# Patient Record
Sex: Male | Born: 1985 | Hispanic: No | Marital: Single | Smoking: Never smoker
Health system: Southern US, Community
[De-identification: ages and names within clinical notes are randomized; demographics above are authoritative.]

## PROBLEM LIST (undated history)

## (undated) DIAGNOSIS — R7303 Prediabetes: Secondary | ICD-10-CM

## (undated) DIAGNOSIS — B019 Varicella without complication: Secondary | ICD-10-CM

## (undated) DIAGNOSIS — E785 Hyperlipidemia, unspecified: Secondary | ICD-10-CM

## (undated) HISTORY — DX: Varicella without complication: B01.9

## (undated) HISTORY — DX: Prediabetes: R73.03

## (undated) HISTORY — DX: Hyperlipidemia, unspecified: E78.5

---

## 2006-07-13 HISTORY — PX: FEMUR FRACTURE SURGERY: SHX633

## 2012-06-17 ENCOUNTER — Emergency Department: Payer: Self-pay | Admitting: Emergency Medicine

## 2015-09-12 ENCOUNTER — Encounter: Payer: Self-pay | Admitting: Registered Nurse

## 2015-09-12 ENCOUNTER — Ambulatory Visit: Payer: Self-pay | Admitting: Registered Nurse

## 2015-09-12 VITALS — BP 110/80 | HR 80 | Temp 98.8°F

## 2015-09-12 DIAGNOSIS — R197 Diarrhea, unspecified: Secondary | ICD-10-CM

## 2015-09-12 MED ORDER — LORATADINE 10 MG PO TABS: 10.0000 mg | ORAL_TABLET | Freq: Every day | ORAL | Status: DC

## 2015-09-12 MED ORDER — FLUTICASONE PROPIONATE 50 MCG/ACT NA SUSP: 1.0000 | Freq: Two times a day (BID) | NASAL | Status: DC

## 2015-09-12 NOTE — Progress Notes (Signed)
Subjective:    Patient ID: Jimmy Clark, male    DOB: 1986-04-14, 30 y.o.   MRN: [id number]  HPI Comments: Single african [place] male here for evaluation of vomiting, cough, runny nose, diarrhea.  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  Vomiting started on his day off 28 Feb after going to movies he ate cereal went to bed and woke up vomiting every 30 to 45 minutes that evening x 5 episodes (contents of stomach), chills, nonproductive cough, post nasal drip.  Denied fever.  Headache this am tolerating clear liquids urine still dark yellow.  Took motrin and felt better 28 Feb.  Diarrhea watery brown multiple times per day denied hematochezia bright red or black.  Chest congestion  Typically has spring allergies uses whatever cheapest allegra, zyrtec, claritin all work for him to stop runny nose.  Last week runny nose, sinus congestion and fatigue was worse. Missed a couple days of work last week also. Fatigued today.  New patient.     Review of Systems  Constitutional: Positive for chills, diaphoresis, activity change and fatigue. Negative for fever, appetite change and unexpected weight change.  HENT: Positive for congestion, postnasal drip, rhinorrhea, sinus pressure and sneezing. Negative for dental problem, drooling, ear discharge, ear pain, facial swelling, hearing loss, mouth sores, nosebleeds, sore throat, tinnitus, trouble swallowing and voice change.   Eyes: Negative for photophobia, pain, discharge, redness, itching and visual disturbance.  Respiratory: Positive for cough. Negative for choking, chest tightness, shortness of breath, wheezing and stridor.   Cardiovascular: Negative for chest pain, palpitations and leg swelling.  Gastrointestinal: Positive for nausea, vomiting, abdominal pain and diarrhea. Negative for constipation, blood in stool and abdominal distention.  Endocrine: Negative for cold intolerance and heat  intolerance.  Genitourinary: Negative for dysuria.  Musculoskeletal: Negative for myalgias, back pain, joint swelling, arthralgias, gait problem, neck pain and neck stiffness.  Skin: Negative for color change, pallor, rash and wound.  Allergic/Immunologic: Positive for environmental allergies. Negative for food allergies and immunocompromised state.  Neurological: Positive for headaches. Negative for dizziness, tremors, seizures, syncope, facial asymmetry, speech difficulty, weakness, light-headedness and numbness.  Hematological: Negative for adenopathy. Does not bruise/bleed easily.  Psychiatric/Behavioral: Positive for sleep disturbance. Negative for behavioral problems, confusion and agitation.       Objective:   Physical Exam  Constitutional: He is oriented to person, place, and time. Vital signs are normal. He appears well-developed and well-nourished. He is active and cooperative.  Non-toxic appearance. He does not have a sickly appearance. He appears ill. No distress.  HENT:  Head: Normocephalic and atraumatic.  Right Ear: Hearing, external ear and ear canal normal. A middle ear effusion is present.  Left Ear: Hearing, external ear and ear canal normal. A middle ear effusion is present.  Nose: Mucosal edema and rhinorrhea present. No nose lacerations, sinus tenderness, nasal deformity, septal deviation or nasal septal hematoma. No epistaxis.  No foreign bodies. Right sinus exhibits no maxillary sinus tenderness and no frontal sinus tenderness. Left sinus exhibits no maxillary sinus tenderness and no frontal sinus tenderness.  Mouth/Throat: Uvula is midline and mucous membranes are normal. Mucous membranes are not pale, not dry and not cyanotic. He does not have dentures. No oral lesions. No trismus in the jaw. Normal dentition. No dental abscesses, uvula swelling, lacerations or dental caries. Posterior oropharyngeal edema and posterior oropharyngeal erythema present. No oropharyngeal  exudate or tonsillar abscesses.  Cobblestoning posterior pharynx; bilateral nasal turbinates with  edema/erythema/white discharge; bilateral TMs with air fluid level slight opacity; bilateral tonsils 1+ edema no exudate  Eyes: Conjunctivae, EOM and lids are normal. Pupils are equal, round, and reactive to light. Right eye exhibits no chemosis, no discharge, no exudate and no hordeolum. No foreign body present in the right eye. Left eye exhibits no chemosis, no discharge, no exudate and no hordeolum. No foreign body present in the left eye. Right conjunctiva is not injected. Right conjunctiva has no hemorrhage. Left conjunctiva is not injected. Left conjunctiva has no hemorrhage. No scleral icterus. Right eye exhibits normal extraocular motion and no nystagmus. Left eye exhibits normal extraocular motion and no nystagmus. Right pupil is round and reactive. Left pupil is round and reactive. Pupils are equal.  Neck: Trachea normal and normal range of motion. Neck supple. No tracheal tenderness, no spinous process tenderness and no muscular tenderness present. No rigidity. No tracheal deviation, no edema, no erythema and normal range of motion present. No thyroid mass and no thyromegaly present.  Cardiovascular: Normal rate, regular rhythm, S1 normal, S2 normal, normal heart sounds and intact distal pulses.  PMI is not displaced.  Exam reveals no gallop and no friction rub.   No murmur heard. Pulmonary/Chest: Effort normal and breath sounds normal. No stridor. No respiratory distress. He has no decreased breath sounds. He has no wheezes. He has no rhonchi. He has no rales.  Abdominal: Soft. He exhibits no shifting dullness, no distension, no pulsatile liver, no fluid wave, no abdominal bruit, no ascites, no pulsatile midline mass and no mass. Bowel sounds are decreased. There is no hepatosplenomegaly. There is tenderness in the epigastric area, left upper quadrant and left lower quadrant. There is no rigidity, no  rebound, no guarding, no CVA tenderness, no tenderness at McBurney's point and negative Murphy's sign. Hernia confirmed negative in the ventral area.  Dull to percussion x 4 quads; c/o pain with percussion RLQ; moves from sitting to standing to lying without difficulty  Musculoskeletal: Normal range of motion. He exhibits no edema or tenderness.  Lymphadenopathy:       Head (right side): No submental, no submandibular, no tonsillar, no preauricular, no posterior auricular and no occipital adenopathy present.       Head (left side): No submental, no submandibular, no tonsillar, no preauricular, no posterior auricular and no occipital adenopathy present.    He has no cervical adenopathy.       Right cervical: No superficial cervical, no deep cervical and no posterior cervical adenopathy present.      Left cervical: No superficial cervical, no deep cervical and no posterior cervical adenopathy present.  Neurological: He is alert and oriented to person, place, and time. He displays no atrophy and no tremor. No cranial nerve deficit or sensory deficit. He exhibits normal muscle tone. He displays no seizure activity. Coordination and gait normal. GCS eye subscore is 4. GCS verbal subscore is 5. GCS motor subscore is 6.  Skin: Skin is warm, dry and intact. No abrasion, no bruising, no burn, no ecchymosis, no laceration, no lesion, no petechiae and no rash noted. He is not diaphoretic. No cyanosis or erythema. No pallor. Nails show no clubbing.  Psychiatric: He has a normal mood and affect. His speech is normal and behavior is normal. Judgment and thought content normal. Cognition and memory are normal.  Nursing note and vitals reviewed.     1130 patient notified rapid flu test negative. Patient verbalized understanding and had no further questions at this time.  Assessment & Plan:  A-viral gastroenteritis, seasonal allergic rhinitis, otitis media with effusion bilaterally P-Work excuse x 48 hours.  I  have recommended clear fluids and bland diet.  Avoid dairy/spicy, fried and large portions of meat while having nausea.  If vomiting hold po intake x 1 hour.  Then sips clear fluids like broths, ginger ale, power ade, gatorade, pedialyte may advance to soft/bland if no vomiting x 24 hours and appetite returned otherwise hydration main focus.  Hydrate hydrate hydrate until urine pale yellow/clear.  Discussed headache probably from dehydration/viral illness combination.   Return to the clinic if symptoms persist or worsen; I have alerted the patient to call if high fever, dehydration, marked weakness, fainting, increased abdominal pain, blood in stool or vomit (red or black).    Patient verbalized agreement and understanding of treatment plan and had no further questions at this time.  Restart claritin 10mg  po daily and flonase 1 spray each nostril BID.  Patient may use normal saline nasal spray as needed. Avoid triggers if possible.  Shower prior to bedtime if exposed to triggers.  If allergic dust/dust mites recommend mattress/pillow covers/encasements; washing linens, vacuuming, sweeping, dusting weekly.  Call or return to clinic as needed if these symptoms worsen or fail to improve as anticipated. Patient verbalized understanding of instructions, agreed with plan of care and had no further questions at this time.  P2:  Avoidance and hand washing.  Supportive treatment.   No evidence of invasive bacterial infection, non toxic and well hydrated.  This is most likely self limiting viral infection.  I do not see where any further testing or imaging is necessary at this time.   I will suggest supportive care, rest, good hygiene and encourage the patient to take adequate fluids.  The patient is to return to clinic or EMERGENCY ROOM if symptoms worsen or change significantly e.g. ear pain, fever, purulent discharge from ears or bleeding.  Patient verbalized agreement and understanding of treatment plan.

## 2015-09-17 ENCOUNTER — Ambulatory Visit: Payer: Self-pay | Admitting: Physician Assistant

## 2015-09-24 DIAGNOSIS — H5213 Myopia, bilateral: Secondary | ICD-10-CM | POA: Diagnosis not present

## 2015-09-30 ENCOUNTER — Ambulatory Visit (INDEPENDENT_AMBULATORY_CARE_PROVIDER_SITE_OTHER): Payer: 59 | Admitting: Family Medicine

## 2015-09-30 ENCOUNTER — Encounter: Payer: Self-pay | Admitting: Family Medicine

## 2015-09-30 VITALS — BP 124/82 | HR 80 | Temp 98.5°F | Ht 77.0 in | Wt 297.5 lb

## 2015-09-30 DIAGNOSIS — Z130 Encounter for screening for diseases of the blood and blood-forming organs and certain disorders involving the immune mechanism: Secondary | ICD-10-CM | POA: Diagnosis not present

## 2015-09-30 DIAGNOSIS — Z0000 Encounter for general adult medical examination without abnormal findings: Secondary | ICD-10-CM

## 2015-09-30 DIAGNOSIS — E669 Obesity, unspecified: Secondary | ICD-10-CM | POA: Diagnosis not present

## 2015-09-30 LAB — COMPREHENSIVE METABOLIC PANEL
ALBUMIN: 4.1 g/dL (ref 3.5–5.2)
ALK PHOS: 63 U/L (ref 39–117)
ALT: 59 U/L — AB (ref 0–53)
AST: 26 U/L (ref 0–37)
BILIRUBIN TOTAL: 0.3 mg/dL (ref 0.2–1.2)
BUN: 14 mg/dL (ref 6–23)
CALCIUM: 9.5 mg/dL (ref 8.4–10.5)
CO2: 29 mEq/L (ref 19–32)
Chloride: 104 mEq/L (ref 96–112)
Creatinine, Ser: 1.03 mg/dL (ref 0.40–1.50)
GFR: 109.81 mL/min (ref >60.00)
GLUCOSE: 89 mg/dL (ref 70–99)
Potassium: 4.2 mEq/L (ref 3.5–5.1)
Sodium: 139 mEq/L (ref 135–145)
TOTAL PROTEIN: 6.7 g/dL (ref 6.0–8.3)

## 2015-09-30 LAB — CBC
HCT: 41.9 % (ref 39.0–52.0)
HEMOGLOBIN: 13.6 g/dL (ref 13.0–17.0)
MCHC: 32.6 g/dL (ref 30.0–36.0)
MCV: 80.6 fl (ref 78.0–100.0)
PLATELETS: 311 10*3/uL (ref 150.0–400.0)
RBC: 5.19 Mil/uL (ref 4.22–5.81)
RDW: 14.3 % (ref 11.5–15.5)
WBC: 6 10*3/uL (ref 4.0–10.5)

## 2015-09-30 LAB — LIPID PANEL
CHOL/HDL RATIO: 6
Cholesterol: 147 mg/dL (ref 0–200)
HDL: 23 mg/dL — ABNORMAL LOW (ref >39.00)
LDL Cholesterol: 91 mg/dL (ref 0–99)
NonHDL: 124.22
TRIGLYCERIDES: 168 mg/dL — AB (ref 0.0–149.0)
VLDL: 33.6 mg/dL (ref 0.0–40.0)

## 2015-09-30 LAB — HEMOGLOBIN A1C: Hgb A1c MFr Bld: 6.2 % (ref 4.6–6.5)

## 2015-09-30 NOTE — Progress Notes (Signed)
Subjective:  Patient ID: Jimmy Clark, male    DOB: 1986-05-25  Age: 30 y.o. MRN: [id number]  CC: Establish care  HPI Jimmy Clark is a 30 y.o. male presents to the clinic today to establish care. No concerns today.  Preventative Healthcare  Immunizations  Tetanus - Up to date.   Pneumococcal - Not indicated.  Flu - Up to date.  Zoster - Not indicated.   Labs: Screening labs today.   Exercise: Screening labs today.   Alcohol use: See below.  Smoking/tobacco use: Nonsmoker.  Wears seat belt: Yes.  PMH, Surgical Hx, Family Hx, Social History reviewed and updated as below.  Past Medical History  Diagnosis Date  . Chicken pox    Past Surgical History  Procedure Laterality Date  . Femur fracture surgery  2008   Family hx - patient denies any family history.  Social History  Substance Use Topics  . Smoking status: Never Smoker   . Smokeless tobacco: Never Used  . Alcohol Use: 0.0 - 3.0 oz/week    0-4 Standard drinks or equivalent, 0-1 Cans of beer per week   Review of Systems  Gastrointestinal:       Recent nausea/vomiting/diarrhea (now resolved).   Musculoskeletal:       Muscle cramps/aches.   Psychiatric/Behavioral:       Stress.  All other systems reviewed and are negative.  Objective:   Today's Vitals: BP 124/82 mmHg  Pulse 80  Temp(Src) 98.5 F (36.9 C) (Oral)  Ht 6' 5" (1.956 m)  Wt 297 lb 8 oz (134.945 kg)  BMI 35.27 kg/m2  SpO2 96%  Physical Exam  Constitutional: He is oriented to person, place, and time. He appears well-developed and well-nourished. No distress.  HENT:  Head: Normocephalic and atraumatic.  Mouth/Throat: Oropharynx is clear and moist. No oropharyngeal exudate.  Normal TM's bilaterally.   Eyes: Conjunctivae are normal. No scleral icterus.  Neck: Neck supple. No thyromegaly present.  Cardiovascular: Normal rate and regular rhythm.   No murmur heard. Pulmonary/Chest: Effort normal and breath sounds normal. He has  no wheezes. He has no rales.  Abdominal: Soft. He exhibits no distension. There is no tenderness. There is no rebound and no guarding.  Musculoskeletal: Normal range of motion. He exhibits no edema.  Lymphadenopathy:    He has no cervical adenopathy.  Neurological: He is alert and oriented to person, place, and time.  Skin: Skin is warm and dry. No rash noted.  Psychiatric: He has a normal mood and affect.  Vitals reviewed.  Assessment & Plan:   Problem List Items Addressed This Visit    Preventative health care - Primary    Tdap and flu up to date. Discussed diet, exercise, weight loss. Screening labs today.        Other Visit Diagnoses    Screening for deficiency anemia        Relevant Orders    CBC    Obesity (BMI 30-39.9)        Relevant Orders    Comp Met (CMET)    HgB A1c    Lipid Profile       Outpatient Encounter Prescriptions as of 09/30/2015  Medication Sig  . fluticasone (FLONASE) 50 MCG/ACT nasal spray Place 1 spray into both nostrils 2 (two) times daily.  Marland Kitchen loratadine (CLARITIN) 10 MG tablet Take 1 tablet (10 mg total) by mouth daily.   No facility-administered encounter medications on file as of 09/30/2015.   Follow-up: Annually  Miaya Lafontant  Cook DO [facility] [place]      

## 2015-09-30 NOTE — Progress Notes (Signed)
Pre visit review using our clinic review tool, if applicable. No additional management support is needed unless otherwise documented below in the visit note. 

## 2015-09-30 NOTE — Patient Instructions (Signed)
We will call with your results.  Follow up annually or sooner as needed.  Take care  Dr. Adriana Simasook   Health Maintenance, Male A healthy lifestyle and preventative care can promote health and wellness.  Maintain regular health, dental, and eye exams.  Eat a healthy diet. Foods like vegetables, fruits, whole grains, low-fat dairy products, and lean protein foods contain the nutrients you need and are low in calories. Decrease your intake of foods high in solid fats, added sugars, and salt. Get information about a proper diet from your health care provider, if necessary.  Regular physical exercise is one of the most important things you can do for your health. Most adults should get at least 150 minutes of moderate-intensity exercise (any activity that increases your heart rate and causes you to sweat) each week. In addition, most adults need muscle-strengthening exercises on 2 or more days a week.   Maintain a healthy weight. The body mass index (BMI) is a screening tool to identify possible weight problems. It provides an estimate of body fat based on height and weight. Your health care provider can find your BMI and can help you achieve or maintain a healthy weight. For males 20 years and older:  A BMI below 18.5 is considered underweight.  A BMI of 18.5 to 24.9 is normal.  A BMI of 25 to 29.9 is considered overweight.  A BMI of 30 and above is considered obese.  Maintain normal blood lipids and cholesterol by exercising and minimizing your intake of saturated fat. Eat a balanced diet with plenty of fruits and vegetables. Blood tests for lipids and cholesterol should begin at age 29 and be repeated every 5 years. If your lipid or cholesterol levels are high, you are over age 29, or you are at high risk for heart disease, you may need your cholesterol levels checked more frequently.Ongoing high lipid and cholesterol levels should be treated with medicines if diet and exercise are not  working.  If you smoke, find out from your health care provider how to quit. If you do not use tobacco, do not start.  Lung cancer screening is recommended for adults aged 55-80 years who are at high risk for developing lung cancer because of a history of smoking. A yearly low-dose CT scan of the lungs is recommended for people who have at least a 30-pack-year history of smoking and are current smokers or have quit within the past 15 years. A pack year of smoking is smoking an average of 1 pack of cigarettes a day for 1 year (for example, a 30-pack-year history of smoking could mean smoking 1 pack a day for 30 years or 2 packs a day for 15 years). Yearly screening should continue until the smoker has stopped smoking for at least 15 years. Yearly screening should be stopped for people who develop a health problem that would prevent them from having lung cancer treatment.  If you choose to drink alcohol, do not have more than 2 drinks per day. One drink is considered to be 12 oz (360 mL) of beer, 5 oz (150 mL) of wine, or 1.5 oz (45 mL) of liquor.  Avoid the use of street drugs. Do not share needles with anyone. Ask for help if you need support or instructions about stopping the use of drugs.  High blood pressure causes heart disease and increases the risk of stroke. High blood pressure is more likely to develop in:  People who have blood pressure in the  end of the normal range (100-139/85-89 mm Hg).  People who are overweight or obese.  People who are African American.  If you are 70-73 years of age, have your blood pressure checked every 3-5 years. If you are 37 years of age or older, have your blood pressure checked every year. You should have your blood pressure measured twice--once when you are at a hospital or clinic, and once when you are not at a hospital or clinic. Record the average of the two measurements. To check your blood pressure when you are not at a hospital or clinic, you can  use:  An automated blood pressure machine at a pharmacy.  A home blood pressure monitor.  If you are 9-5 years old, ask your health care provider if you should take aspirin to prevent heart disease.  Diabetes screening involves taking a blood sample to check your fasting blood sugar level. This should be done once every 3 years after age 82 if you are at a normal weight and without risk factors for diabetes. Testing should be considered at a younger age or be carried out more frequently if you are overweight and have at least 1 risk factor for diabetes.  Colorectal cancer can be detected and often prevented. Most routine colorectal cancer screening begins at the age of 9 and continues through age 58. However, your health care provider may recommend screening at an earlier age if you have risk factors for colon cancer. On a yearly basis, your health care provider may provide home test kits to check for hidden blood in the stool. A small camera at the end of a tube may be used to directly examine the colon (sigmoidoscopy or colonoscopy) to detect the earliest forms of colorectal cancer. Talk to your health care provider about this at age 27 when routine screening begins. A direct exam of the colon should be repeated every 5-10 years through age 41, unless early forms of precancerous polyps or small growths are found.  People who are at an increased risk for hepatitis B should be screened for this virus. You are considered at high risk for hepatitis B if:  You were born in a country where hepatitis B occurs often. Talk with your health care provider about which countries are considered high risk.  Your parents were born in a high-risk country and you have not received a shot to protect against hepatitis B (hepatitis B vaccine).  You have HIV or AIDS.  You use needles to inject street drugs.  You live with, or have sex with, someone who has hepatitis B.  You are a man who has sex with other  men (MSM).  You get hemodialysis treatment.  You take certain medicines for conditions like cancer, organ transplantation, and autoimmune conditions.  Hepatitis C blood testing is recommended for all people born from [birth year] through 1964 and any individual with known risk factors for hepatitis C.  Healthy men should no longer receive prostate-specific antigen (PSA) blood tests as part of routine cancer screening. Talk to your health care provider about prostate cancer screening.  Testicular cancer screening is not recommended for adolescents or adult males who have no symptoms. Screening includes self-exam, a health care provider exam, and other screening tests. Consult with your health care provider about any symptoms you have or any concerns you have about testicular cancer.  Practice safe sex. Use condoms and avoid high-risk sexual practices to reduce the spread of sexually transmitted infections (STIs).  You should  be screened for STIs, including gonorrhea and chlamydia if:  You are sexually active and are younger than 24 years.  You are older than 24 years, and your health care provider tells you that you are at risk for this type of infection.  Your sexual activity has changed since you were last screened, and you are at an increased risk for chlamydia or gonorrhea. Ask your health care provider if you are at risk.  If you are at risk of being infected with HIV, it is recommended that you take a prescription medicine daily to prevent HIV infection. This is called pre-exposure prophylaxis (PrEP). You are considered at risk if:  You are a man who has sex with other men (MSM).  You are a heterosexual man who is sexually active with multiple partners.  You take drugs by injection.  You are sexually active with a partner who has HIV.  Talk with your health care provider about whether you are at high risk of being infected with HIV. If you choose to begin PrEP, you should first be tested  for HIV. You should then be tested every 3 months for as long as you are taking PrEP.  Use sunscreen. Apply sunscreen liberally and repeatedly throughout the day. You should seek shade when your shadow is shorter than you. Protect yourself by wearing long sleeves, pants, a wide-brimmed hat, and sunglasses year round whenever you are outdoors.  Tell your health care provider of new moles or changes in moles, especially if there is a change in shape or color. Also, tell your health care provider if a mole is larger than the size of a pencil eraser.  A one-time screening for abdominal aortic aneurysm (AAA) and surgical repair of large AAAs by ultrasound is recommended for men aged 19-75 years who are current or former smokers.  Stay current with your vaccines (immunizations).   This information is not intended to replace advice given to you by your health care provider. Make sure you discuss any questions you have with your health care provider.   Document Released: 12/26/2007 Document Revised: 07/20/2014 Document Reviewed: 11/24/2010 Elsevier Interactive Patient Education [employer].

## 2015-09-30 NOTE — Assessment & Plan Note (Signed)
Tdap and flu up to date. Discussed diet, exercise, weight loss. Screening labs today.

## 2015-10-04 ENCOUNTER — Encounter: Payer: Self-pay | Admitting: Physician Assistant

## 2015-10-04 ENCOUNTER — Ambulatory Visit: Payer: Self-pay | Admitting: Physician Assistant

## 2015-10-04 VITALS — BP 154/89 | HR 60 | Temp 99.0°F

## 2015-10-04 DIAGNOSIS — M778 Other enthesopathies, not elsewhere classified: Secondary | ICD-10-CM

## 2015-10-04 MED ORDER — NAPROXEN 500 MG PO TABS: 500.0000 mg | ORAL_TABLET | Freq: Two times a day (BID) | ORAL | Status: DC

## 2015-10-04 NOTE — Progress Notes (Signed)
   Subjective:Right elbow pain    Patient ID: Jimmy Clark, male    DOB: March 22, 1986, 30 y.o.   MRN: [id number]  HPI Patient states acute onset of right elbow pain and limited extension for 2 days. Compliant start after 2 days of onset of weight lifting. Patient started weight lifting program and believed overuse competing with friend. Patient is right hand dominate. No palliative measures except for Tylenol. States pain is 7/10 with extension. Pain is 4/10 with flexion. Describes pain as "achy".    Review of Systems Seasonal Rhinitis.    Objective:   Physical Exam Patient is right hand dominate. No obvious deformity of right upper extremity. N/V intact.  Extension limited by compliant of pain at 160 degrees. Strength is 4/5.       Assessment & Plan:  Tendinitis right elbow secondary to overuse.  Decrease weight training for one week. Start Naproxen and follow up in 3 days for re-evaluation.

## 2015-10-15 ENCOUNTER — Telehealth: Payer: Self-pay | Admitting: *Deleted

## 2015-10-15 ENCOUNTER — Other Ambulatory Visit: Payer: Self-pay | Admitting: *Deleted

## 2015-10-15 ENCOUNTER — Other Ambulatory Visit: Payer: Self-pay

## 2015-10-15 DIAGNOSIS — R748 Abnormal levels of other serum enzymes: Secondary | ICD-10-CM

## 2015-10-15 NOTE — Telephone Encounter (Signed)
CMP; Elevated liver enzymes.

## 2015-10-15 NOTE — Telephone Encounter (Signed)
Labs and dx?  

## 2016-08-11 DIAGNOSIS — H5213 Myopia, bilateral: Secondary | ICD-10-CM | POA: Diagnosis not present

## 2016-08-18 ENCOUNTER — Ambulatory Visit (INDEPENDENT_AMBULATORY_CARE_PROVIDER_SITE_OTHER): Payer: 59 | Admitting: Family Medicine

## 2016-08-18 ENCOUNTER — Encounter: Payer: Self-pay | Admitting: Family Medicine

## 2016-08-18 VITALS — BP 118/72 | HR 84 | Temp 98.7°F | Wt 306.4 lb

## 2016-08-18 DIAGNOSIS — Z0000 Encounter for general adult medical examination without abnormal findings: Secondary | ICD-10-CM | POA: Diagnosis not present

## 2016-08-18 DIAGNOSIS — R7303 Prediabetes: Secondary | ICD-10-CM | POA: Insufficient documentation

## 2016-08-18 DIAGNOSIS — E786 Lipoprotein deficiency: Secondary | ICD-10-CM | POA: Insufficient documentation

## 2016-08-18 LAB — LIPID PANEL
Cholesterol: 168 mg/dL (ref 0–200)
HDL: 27.8 mg/dL — ABNORMAL LOW (ref >39.00)
LDL Cholesterol: 107 mg/dL — ABNORMAL HIGH (ref 0–99)
NONHDL: 140.63
Total CHOL/HDL Ratio: 6
Triglycerides: 166 mg/dL — ABNORMAL HIGH (ref 0.0–149.0)
VLDL: 33.2 mg/dL (ref 0.0–40.0)

## 2016-08-18 LAB — CBC
HEMATOCRIT: 44.8 % (ref 39.0–52.0)
Hemoglobin: 14.5 g/dL (ref 13.0–17.0)
MCHC: 32.4 g/dL (ref 30.0–36.0)
MCV: 83.3 fl (ref 78.0–100.0)
Platelets: 311 10*3/uL (ref 150.0–400.0)
RBC: 5.38 Mil/uL (ref 4.22–5.81)
RDW: 14.4 % (ref 11.5–15.5)
WBC: 5.9 10*3/uL (ref 4.0–10.5)

## 2016-08-18 LAB — COMPREHENSIVE METABOLIC PANEL
ALT: 58 U/L — AB (ref 0–53)
AST: 26 U/L (ref 0–37)
Albumin: 4.2 g/dL (ref 3.5–5.2)
Alkaline Phosphatase: 50 U/L (ref 39–117)
BILIRUBIN TOTAL: 0.3 mg/dL (ref 0.2–1.2)
BUN: 14 mg/dL (ref 6–23)
CO2: 27 mEq/L (ref 19–32)
Calcium: 9.2 mg/dL (ref 8.4–10.5)
Chloride: 107 mEq/L (ref 96–112)
Creatinine, Ser: 1.06 mg/dL (ref 0.40–1.50)
GFR: 105.58 mL/min (ref >60.00)
GLUCOSE: 97 mg/dL (ref 70–99)
Potassium: 3.9 mEq/L (ref 3.5–5.1)
SODIUM: 138 meq/L (ref 135–145)
TOTAL PROTEIN: 7 g/dL (ref 6.0–8.3)

## 2016-08-18 LAB — HEMOGLOBIN A1C: HEMOGLOBIN A1C: 6.2 % (ref 4.6–6.5)

## 2016-08-18 NOTE — Progress Notes (Signed)
   Subjective:  Patient ID: Jimmy Clark, male    DOB: 01/18/1986  Age: 31 y.o. MRN: [id number]  CC: Annual exam  HPI Jimmy Clark is a 31 y.o. male presents to the clinic today for an annual exam.  Preventative Healthcare  Immunizations  Tetanus - Up to date.  Flu - Up to date.  Labs: Labs today.  Exercise: Just joined gym. Encouraged exercise.   Alcohol use: See below.  Smoking/tobacco use: No.  PMH, Surgical Hx, Family Hx, Social History reviewed and updated as below.  Past Medical History:  Diagnosis Date  . Chicken pox    Past Surgical History:  Procedure Laterality Date  . FEMUR FRACTURE SURGERY  2008   No family history on file. Social History  Substance Use Topics  . Smoking status: Never Smoker  . Smokeless tobacco: Never Used  . Alcohol use 0.0 - 3.0 oz/week   Review of Systems  Musculoskeletal:       Intermittent arm pain.  All other systems reviewed and are negative.  Objective:   Today's Vitals: BP 118/72   Pulse 84   Temp 98.7 F (37.1 C) (Oral)   Wt (!) 306 lb 6.4 oz (139 kg)   SpO2 98%   BMI 36.33 kg/m   Physical Exam  Constitutional: He is oriented to person, place, and time. He appears well-developed and well-nourished. No distress.  HENT:  Head: Normocephalic and atraumatic.  Nose: Nose normal.  Mouth/Throat: Oropharynx is clear and moist. No oropharyngeal exudate.  Normal TM's bilaterally.   Eyes: Conjunctivae are normal. No scleral icterus.  Neck: Neck supple.  Cardiovascular: Normal rate and regular rhythm.   No murmur heard. Pulmonary/Chest: Effort normal and breath sounds normal. He has no wheezes. He has no rales.  Abdominal: Soft. He exhibits no distension. There is no tenderness. There is no rebound and no guarding.  Musculoskeletal: Normal range of motion. He exhibits no edema.  Lymphadenopathy:    He has no cervical adenopathy.  Neurological: He is alert and oriented to person, place, and time.  Skin: Skin is  warm and dry. No rash noted.  Psychiatric: He has a normal mood and affect.  Vitals reviewed.  Assessment & Plan:   Problem List Items Addressed This Visit    Annual physical exam - Primary    Advised diet and exercise. Labs today. Immunizations up to date.      Relevant Orders   CBC   Hemoglobin A1c   Comprehensive metabolic panel   Lipid panel      Outpatient Encounter Prescriptions as of 08/18/2016  Medication Sig  . [DISCONTINUED] fluticasone (FLONASE) 50 MCG/ACT nasal spray Place 1 spray into both nostrils 2 (two) times daily.  . [DISCONTINUED] loratadine (CLARITIN) 10 MG tablet Take 1 tablet (10 mg total) by mouth daily.  . [DISCONTINUED] naproxen (NAPROSYN) 500 MG tablet Take 1 tablet (500 mg total) by mouth 2 (two) times daily with a meal.   No facility-administered encounter medications on file as of 08/18/2016.     Follow-up: Annually  Everlene OtherJayce Jeri Rawlins DO [facility] [place]

## 2016-08-18 NOTE — Patient Instructions (Signed)
Diet, exercise.  Follow up annually.  Take care   Dr. Adriana Simasook   Health Maintenance, Male A healthy lifestyle and preventative care can promote health and wellness.  Maintain regular health, dental, and eye exams.  Eat a healthy diet. Foods like vegetables, fruits, whole grains, low-fat dairy products, and lean protein foods contain the nutrients you need and are low in calories. Decrease your intake of foods high in solid fats, added sugars, and salt. Get information about a proper diet from your health care provider, if necessary.  Regular physical exercise is one of the most important things you can do for your health. Most adults should get at least 150 minutes of moderate-intensity exercise (any activity that increases your heart rate and causes you to sweat) each week. In addition, most adults need muscle-strengthening exercises on 2 or more days a week.   Maintain a healthy weight. The body mass index (BMI) is a screening tool to identify possible weight problems. It provides an estimate of body fat based on height and weight. Your health care provider can find your BMI and can help you achieve or maintain a healthy weight. For males 20 years and older:  A BMI below 18.5 is considered underweight.  A BMI of 18.5 to 24.9 is normal.  A BMI of 25 to 29.9 is considered overweight.  A BMI of 30 and above is considered obese.  Maintain normal blood lipids and cholesterol by exercising and minimizing your intake of saturated fat. Eat a balanced diet with plenty of fruits and vegetables. Blood tests for lipids and cholesterol should begin at age 30 and be repeated every 5 years. If your lipid or cholesterol levels are high, you are over age 30, or you are at high risk for heart disease, you may need your cholesterol levels checked more frequently.Ongoing high lipid and cholesterol levels should be treated with medicines if diet and exercise are not working.  If you smoke, find out from  your health care provider how to quit. If you do not use tobacco, do not start.  Lung cancer screening is recommended for adults aged 55-80 years who are at high risk for developing lung cancer because of a history of smoking. A yearly low-dose CT scan of the lungs is recommended for people who have at least a 30-pack-year history of smoking and are current smokers or have quit within the past 15 years. A pack year of smoking is smoking an average of 1 pack of cigarettes a day for 1 year (for example, a 30-pack-year history of smoking could mean smoking 1 pack a day for 30 years or 2 packs a day for 15 years). Yearly screening should continue until the smoker has stopped smoking for at least 15 years. Yearly screening should be stopped for people who develop a health problem that would prevent them from having lung cancer treatment.  If you choose to drink alcohol, do not have more than 2 drinks per day. One drink is considered to be 12 oz (360 mL) of beer, 5 oz (150 mL) of wine, or 1.5 oz (45 mL) of liquor.  Avoid the use of street drugs. Do not share needles with anyone. Ask for help if you need support or instructions about stopping the use of drugs.  High blood pressure causes heart disease and increases the risk of stroke. High blood pressure is more likely to develop in:  People who have blood pressure in the end of the normal range (100-139/85-89 mm  Hg).  People who are overweight or obese.  People who are African American.  If you are 50-17 years of age, have your blood pressure checked every 3-5 years. If you are 35 years of age or older, have your blood pressure checked every year. You should have your blood pressure measured twice-once when you are at a hospital or clinic, and once when you are not at a hospital or clinic. Record the average of the two measurements. To check your blood pressure when you are not at a hospital or clinic, you can use:  An automated blood pressure machine at  a pharmacy.  A home blood pressure monitor.  If you are 51-30 years old, ask your health care provider if you should take aspirin to prevent heart disease.  Diabetes screening involves taking a blood sample to check your fasting blood sugar level. This should be done once every 3 years after age 74 if you are at a normal weight and without risk factors for diabetes. Testing should be considered at a younger age or be carried out more frequently if you are overweight and have at least 1 risk factor for diabetes.  Colorectal cancer can be detected and often prevented. Most routine colorectal cancer screening begins at the age of 39 and continues through age 5. However, your health care provider may recommend screening at an earlier age if you have risk factors for colon cancer. On a yearly basis, your health care provider may provide home test kits to check for hidden blood in the stool. A small camera at the end of a tube may be used to directly examine the colon (sigmoidoscopy or colonoscopy) to detect the earliest forms of colorectal cancer. Talk to your health care provider about this at age 12 when routine screening begins. A direct exam of the colon should be repeated every 5-10 years through age 9, unless early forms of precancerous polyps or small growths are found.  People who are at an increased risk for hepatitis B should be screened for this virus. You are considered at high risk for hepatitis B if:  You were born in a country where hepatitis B occurs often. Talk with your health care provider about which countries are considered high risk.  Your parents were born in a high-risk country and you have not received a shot to protect against hepatitis B (hepatitis B vaccine).  You have HIV or AIDS.  You use needles to inject street drugs.  You live with, or have sex with, someone who has hepatitis B.  You are a man who has sex with other men (MSM).  You get hemodialysis  treatment.  You take certain medicines for conditions like cancer, organ transplantation, and autoimmune conditions.  Hepatitis C blood testing is recommended for all people born from [birth year] through 1964 and any individual with known risk factors for hepatitis C.  Healthy men should no longer receive prostate-specific antigen (PSA) blood tests as part of routine cancer screening. Talk to your health care provider about prostate cancer screening.  Testicular cancer screening is not recommended for adolescents or adult males who have no symptoms. Screening includes self-exam, a health care provider exam, and other screening tests. Consult with your health care provider about any symptoms you have or any concerns you have about testicular cancer.  Practice safe sex. Use condoms and avoid high-risk sexual practices to reduce the spread of sexually transmitted infections (STIs).  You should be screened for STIs, including gonorrhea and  chlamydia if:  You are sexually active and are younger than 24 years.  You are older than 24 years, and your health care provider tells you that you are at risk for this type of infection.  Your sexual activity has changed since you were last screened, and you are at an increased risk for chlamydia or gonorrhea. Ask your health care provider if you are at risk.  If you are at risk of being infected with HIV, it is recommended that you take a prescription medicine daily to prevent HIV infection. This is called pre-exposure prophylaxis (PrEP). You are considered at risk if:  You are a man who has sex with other men (MSM).  You are a heterosexual man who is sexually active with multiple partners.  You take drugs by injection.  You are sexually active with a partner who has HIV.  Talk with your health care provider about whether you are at high risk of being infected with HIV. If you choose to begin PrEP, you should first be tested for HIV. You should then be tested  every 3 months for as long as you are taking PrEP.  Use sunscreen. Apply sunscreen liberally and repeatedly throughout the day. You should seek shade when your shadow is shorter than you. Protect yourself by wearing long sleeves, pants, a wide-brimmed hat, and sunglasses year round whenever you are outdoors.  Tell your health care provider of new moles or changes in moles, especially if there is a change in shape or color. Also, tell your health care provider if a mole is larger than the size of a pencil eraser.  A one-time screening for abdominal aortic aneurysm (AAA) and surgical repair of large AAAs by ultrasound is recommended for men aged 53-75 years who are current or former smokers.  Stay current with your vaccines (immunizations). This information is not intended to replace advice given to you by your health care provider. Make sure you discuss any questions you have with your health care provider. Document Released: 12/26/2007 Document Revised: 07/20/2014 Document Reviewed: 04/02/2015 Elsevier Interactive Patient Education  2017 [employer].

## 2016-08-18 NOTE — Assessment & Plan Note (Signed)
Advised diet and exercise. Labs today. Immunizations up to date.

## 2017-08-25 DIAGNOSIS — H5213 Myopia, bilateral: Secondary | ICD-10-CM | POA: Diagnosis not present

## 2017-10-06 ENCOUNTER — Encounter: Payer: Self-pay | Admitting: Family Medicine

## 2017-10-06 ENCOUNTER — Ambulatory Visit (INDEPENDENT_AMBULATORY_CARE_PROVIDER_SITE_OTHER): Payer: Self-pay | Admitting: Family Medicine

## 2017-10-06 VITALS — BP 120/80 | HR 89 | Temp 98.4°F | Wt 295.0 lb

## 2017-10-06 DIAGNOSIS — S39012A Strain of muscle, fascia and tendon of lower back, initial encounter: Secondary | ICD-10-CM

## 2017-10-06 DIAGNOSIS — M545 Low back pain, unspecified: Secondary | ICD-10-CM

## 2017-10-06 MED ORDER — MELOXICAM 15 MG PO TABS: 15.0000 mg | ORAL_TABLET | Freq: Every day | ORAL | 0 refills | Status: AC

## 2017-10-06 MED ORDER — TRAMADOL HCL 50 MG PO TABS: 50.0000 mg | ORAL_TABLET | Freq: Four times a day (QID) | ORAL | 0 refills | Status: AC | PRN

## 2017-10-06 NOTE — Patient Instructions (Addendum)
PLAN< 48 hour at home rest Non-Pharmacologic options: Heat pack, topical pain relief pads, icy hot/bengay/biofreeze (choose one you like- only apply one at a time- wipe clean after use), warm shower or baths and use of epsom salt Mobic: take 1 time a day with food or milk Tramadol: Start with 1 tab (50 mg) to assess efficacy, MAY INCREASE to 2 tablets (100 mg) up to 3 times daily (every 8 hours) For pain if 50 mg is ineffective. Tylenol: for breakthrough pain (1000 mg every 8 hours ONLY if needed as needed) F/U in 48 hours if symptoms not improved Low Back Strain A strain is a stretch or tear in a muscle or the strong cords of tissue that attach muscle to bone (tendons). Strains of the lower back (lumbar spine) are a common cause of low back pain. A strain occurs when muscles or tendons are torn or are stretched beyond their limits. The muscles may become inflamed, resulting in pain and sudden muscle tightening (spasms). A strain can happen suddenly due to an injury (trauma), or it can develop gradually due to overuse. There are three types of strains:  Grade 1 is a mild strain involving a minor tear of the muscle fibers or tendons. This may cause some pain but no loss of muscle strength.  Grade 2 is a moderate strain involving a partial tear of the muscle fibers or tendons. This causes more severe pain and some loss of muscle strength.  Grade 3 is a severe strain involving a complete tear of the muscle or tendon. This causes severe pain and complete or nearly complete loss of muscle strength.  What are the causes? This condition may be caused by:  Trauma, such as a fall or a hit to the body.  Twisting or overstretching the back. This may result from doing activities that require a lot of energy, such as lifting heavy objects.  What increases the risk? The following factors may increase your risk of getting this condition:  Playing contact sports.  Participating in sports or activities  that put excessive stress on the back and require a lot of bending and twisting, including: ? Lifting weights or heavy objects. ? Gymnastics. ? Soccer. ? Figure skating. ? Snowboarding.  Being overweight or obese.  Having poor strength and flexibility.  What are the signs or symptoms? Symptoms of this condition may include:  Sharp or dull pain in the lower back that does not go away. Pain may extend to the buttocks.  Stiffness.  Limited range of motion.  Inability to stand up straight due to stiffness or pain.  Muscle spasms.  How is this diagnosed? This condition may be diagnosed based on:  Your symptoms.  Your medical history.  A physical exam. ? Your health care provider may push on certain areas of your back to determine the source of your pain. ? You may be asked to bend forward, backward, and side to side to assess the severity of your pain and your range of motion.  Imaging tests, such as: ? X-rays. ? MRI.  How is this treated? Treatment for this condition may include:  Applying heat and cold to the affected area.  Medicines to help relieve pain and to relax your muscles (muscle relaxants).  NSAIDs to help reduce swelling and discomfort.  Physical therapy.  When your symptoms improve, it is important to gradually return to your normal routine as soon as possible to reduce pain, avoid stiffness, and avoid loss of muscle strength.  Generally, symptoms should improve within 6 weeks of treatment. However, recovery time varies. Follow these instructions at home: Managing pain, stiffness, and swelling  If directed, apply ice to the injured area during the first 24 hours after your injury. ? Put ice in a plastic bag. ? Place a towel between your skin and the bag. ? Leave the ice on for 20 minutes, 2-3 times a day.  If directed, apply heat to the affected area as often as told by your health care provider. Use the heat source that your health care provider  recommends, such as a moist heat pack or a heating pad. ? Place a towel between your skin and the heat source. ? Leave the heat on for 20-30 minutes. ? Remove the heat if your skin turns bright red. This is especially important if you are unable to feel pain, heat, or cold. You may have a greater risk of getting burned. Activity  Rest and return to your normal activities as told by your health care provider. Ask your health care provider what activities are safe for you.  Avoid activities that take a lot of effort (are strenuous) for as long as told by your health care provider.  Do exercises as told by your health care provider. General instructions   Take over-the-counter and prescription medicines only as told by your health care provider.  If you have questions or concerns about safety while taking pain medicine, talk with your health care provider.  Do not drive or operate heavy machinery until you know how your pain medicine affects you.  Do not use any tobacco products, such as cigarettes, chewing tobacco, and e-cigarettes. Tobacco can delay bone healing. If you need help quitting, ask your health care provider.  Keep all follow-up visits as told by your health care provider. This is important. How is this prevented?  Warm up and stretch before being active.  Cool down and stretch after being active.  Give your body time to rest between periods of activity.  Avoid: ? Being physically inactive for long periods at a time. ? Exercising or playing sports when you are tired or in pain.  Use correct form when playing sports and lifting heavy objects.  Use good posture when sitting and standing.  Maintain a healthy weight.  Sleep on a mattress with medium firmness to support your back.  Make sure to use equipment that fits you, including shoes that fit well.  Be safe and responsible while being active to avoid falls.  Do at least 150 minutes of moderate-intensity  exercise each week, such as brisk walking or water aerobics. Try a form of exercise that takes stress off your back, such as swimming or stationary cycling.  Maintain physical fitness, including: ? Strength. ? Flexibility. ? Cardiovascular fitness. ? Endurance. Contact a health care provider if:  Your back pain does not improve after 6 weeks of treatment.  Your symptoms get worse. Get help right away if:  Your back pain is severe.  You are unable to stand or walk.  You develop pain in your legs.  You develop weakness in your buttocks or legs.  You have difficulty controlling when you urinate or when you have a bowel movement. This information is not intended to replace advice given to you by your health care provider. Make sure you discuss any questions you have with your health care provider. Document Released: 06/29/2005 Document Revised: 03/05/2016 Document Reviewed: 04/10/2015 Elsevier Interactive Patient Education  2017 [employer].  Back Pain, Adult Back pain is very common. The pain often gets better over time. The cause of back pain is usually not dangerous. Most people can learn to manage their back pain on their own. Follow these instructions at home: Watch your back pain for any changes. The following actions may help to lessen any pain you are feeling:  Stay active. Start with short walks on flat ground if you can. Try to walk farther each day.  Exercise regularly as told by your doctor. Exercise helps your back heal faster. It also helps avoid future injury by keeping your muscles strong and flexible.  Do not sit, drive, or stand in one place for more than 30 minutes.  Do not stay in bed. Resting more than 1-2 days can slow down your recovery.  Be careful when you bend or lift an object. Use good form when lifting: ? Bend at your knees. ? Keep the object close to your body. ? Do not twist.  Sleep on a firm mattress. Lie on your side, and bend your knees. If  you lie on your back, put a pillow under your knees.  Take medicines only as told by your doctor.  Put ice on the injured area. ? Put ice in a plastic bag. ? Place a towel between your skin and the bag. ? Leave the ice on for 20 minutes, 2-3 times a day for the first 2-3 days. After that, you can switch between ice and heat packs.  Avoid feeling anxious or stressed. Find good ways to deal with stress, such as exercise.  Maintain a healthy weight. Extra weight puts stress on your back.  Contact a doctor if:  You have pain that does not go away with rest or medicine.  You have worsening pain that goes down into your legs or buttocks.  You have pain that does not get better in one week.  You have pain at night.  You lose weight.  You have a fever or chills. Get help right away if:  You cannot control when you poop (bowel movement) or pee (urinate).  Your arms or legs feel weak.  Your arms or legs lose feeling (numbness).  You feel sick to your stomach (nauseous) or throw up (vomit).  You have belly (abdominal) pain.  You feel like you may pass out (faint). This information is not intended to replace advice given to you by your health care provider. Make sure you discuss any questions you have with your health care provider. Document Released: 12/16/2007 Document Revised: 12/05/2015 Document Reviewed: 10/31/2013 Elsevier Interactive Patient Education  [employer]2018 [employer] Inc[employer]

## 2017-10-06 NOTE — Progress Notes (Signed)
Jimmy Clark is a 32 y.o. male who presents after experiencing pain when coming up from bending over and looking at something on the ground. He reports that the pain was almost immediate and has gradually progressed to moderate pain since that time. He reports using ibuprofen OTC that has done little to improve his pain. He denies any known trauma to his back and denies any systemic symptoms or urinary symptoms or loss of control or radiating pain.  Review of Systems  Constitutional: Negative.   HENT: Negative.   Eyes: Negative.   Respiratory: Negative.   Cardiovascular: Negative.   Gastrointestinal: Negative.   Genitourinary: Negative.   Musculoskeletal: Positive for back pain.  Skin: Negative.   Neurological: Negative.   Psychiatric/Behavioral: Negative.     O: Vitals:   10/06/17 1038  BP: 120/80  Pulse: 89  Temp: 98.4 F (36.9 C)  SpO2: 97%   Physical Exam  Constitutional: Vital signs are normal. He appears well-developed and well-nourished. He appears distressed.  HENT:  Head: Normocephalic.  Cardiovascular: Normal rate and regular rhythm.  Pulmonary/Chest: Effort normal and breath sounds normal.  Abdominal: Soft. Bowel sounds are normal. He exhibits no distension. There is no tenderness. There is no guarding.  Musculoskeletal: He exhibits tenderness.       Lumbar back: He exhibits decreased range of motion, tenderness and pain. He exhibits no bony tenderness, no swelling, no edema, no deformity, no laceration and no spasm.       Back:  Patient with painful full ROM and point tenderness to palpation at marked area that appears visually WNL. Tenderness does  Not extend beyond 1 inch from central lumbar spine area. No evidence of deformity or discoloration.  Skin: Skin is warm. He is diaphoretic.    A: 1. Low back strain, initial encounter     P:  PLAN< 48 hour at home rest Non-Pharmacologic options: Heat pack, topical pain relief pads, icy hot/bengay/biofreeze  (choose one you like- only apply one at a time- wipe clean after use), warm shower or baths and use of epsom salt Mobic: take 1 time a day with food or milk Tramadol: Start with 1 tab (50 mg) to assess efficacy, MAY INCREASE to 2 tablets (100 mg) up to 3 times daily (every 8 hours) For pain if 50 mg is ineffective. Tylenol: for breakthrough pain (1000 mg every 8 hours ONLY if needed as needed) F/U in 48 hours if symptoms not improved  1. Low back strain, initial encounter - meloxicam (MOBIC) 15 MG tablet; Take 1 tablet (15 mg total) by mouth daily for 10 days. - traMADol (ULTRAM) 50 MG tablet; Take 1-2 tablets (50-100 mg total) by mouth every 6 (six) hours as needed for up to 5 days for moderate pain or severe pain.

## 2017-10-08 ENCOUNTER — Encounter: Payer: Self-pay | Admitting: Family Medicine

## 2017-10-08 ENCOUNTER — Ambulatory Visit (INDEPENDENT_AMBULATORY_CARE_PROVIDER_SITE_OTHER): Payer: Self-pay | Admitting: Family Medicine

## 2017-10-08 VITALS — BP 126/84 | HR 91 | Temp 98.4°F | Wt 295.0 lb

## 2017-10-08 DIAGNOSIS — S39012D Strain of muscle, fascia and tendon of lower back, subsequent encounter: Secondary | ICD-10-CM

## 2017-10-08 NOTE — Progress Notes (Signed)
Alecia Lemmingyler M Verstraete 32 y.o. male who presents for follow up for lumbar back pain. His condition is mildly improved currently a 6/10 pain score in comparison to previous evaluation. He reports that all treatments were effective. 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  Review of Systems  Constitutional: Negative.   HENT: Negative.   Eyes: Negative.   Cardiovascular: Negative.   Gastrointestinal: Negative.   Genitourinary: Negative.   Musculoskeletal: Positive for back pain.  Skin: Negative.   Neurological: Negative.   Endo/Heme/Allergies: Negative.   Psychiatric/Behavioral: Negative.    O: Vitals:   10/08/17 1010  BP: 126/84  Pulse: 91  Temp: 98.4 F (36.9 C)  SpO2: 96%   Physical Exam  Constitutional: He is oriented to person, place, and time. He appears well-developed and well-nourished.  Cardiovascular: Normal rate.  Pulmonary/Chest: Effort normal and breath sounds normal.  Abdominal: Soft. Bowel sounds are normal.  Musculoskeletal:       Lumbar back: He exhibits decreased range of motion, tenderness, bony tenderness, pain and spasm.       Back:  Neurological: He is alert and oriented to person, place, and time.  Skin: Skin is warm.  Psychiatric: He has a normal mood and affect.   A: 1. Low back strain, subsequent encounter    P: Patient exam improved and patient provided additional work note for work release until 10/09/2017 given. Patient care plan reviewed and discussed. Patient verbalized understanding and agrees with POC at time of encounter.  1. Low back strain, subsequent encounter

## 2017-10-08 NOTE — Patient Instructions (Signed)
PLAN> Continue Mobic with large meal until complete Continue tramadol PRN for pain F/U if needed  Low Back Strain Rehab Ask your health care provider which exercises are safe for you. Do exercises exactly as told by your health care provider and adjust them as directed. It is normal to feel mild stretching, pulling, tightness, or discomfort as you do these exercises, but you should stop right away if you feel sudden pain or your pain gets worse. Do not begin these exercises until told by your health care provider. Stretching and range of motion exercises These exercises warm up your muscles and joints and improve the movement and flexibility of your back. These exercises also help to relieve pain, numbness, and tingling. Exercise A: Single knee to chest  1. Lie on your back on a firm surface with both legs straight. 2. Bend one of your knees. Use your hands to move your knee up toward your chest until you feel a gentle stretch in your lower back and buttock. ? Hold your leg in this position by holding onto the front of your knee. ? Keep your other leg as straight as possible. 3. Hold for __________ seconds. 4. Slowly return to the starting position. 5. Repeat with your other leg. Repeat __________ times. Complete this exercise __________ times a day. Exercise B: Prone extension on elbows  1. Lie on your abdomen on a firm surface. 2. Prop yourself up on your elbows. 3. Use your arms to help lift your chest up until you feel a gentle stretch in your abdomen and your lower back. ? This will place some of your body weight on your elbows. If this is uncomfortable, try stacking pillows under your chest. ? Your hips should stay down, against the surface that you are lying on. Keep your hip and back muscles relaxed. 4. Hold for __________ seconds. 5. Slowly relax your upper body and return to the starting position. Repeat __________ times. Complete this exercise __________ times a  day. Strengthening exercises These exercises build strength and endurance in your back. Endurance is the ability to use your muscles for a long time, even after they get tired. Exercise C: Pelvic tilt 1. Lie on your back on a firm surface. Bend your knees and keep your feet flat. 2. Tense your abdominal muscles. Tip your pelvis up toward the ceiling and flatten your lower back into the floor. ? To help with this exercise, you may place a small towel under your lower back and try to push your back into the towel. 3. Hold for __________ seconds. 4. Let your muscles relax completely before you repeat this exercise. Repeat __________ times. Complete this exercise __________ times a day. Exercise D: Alternating arm and leg raises  1. Get on your hands and knees on a firm surface. If you are on a hard floor, you may want to use padding to cushion your knees, such as an exercise mat. 2. Line up your arms and legs. Your hands should be below your shoulders, and your knees should be below your hips. 3. Lift your left leg behind you. At the same time, raise your right arm and straighten it in front of you. ? Do not lift your leg higher than your hip. ? Do not lift your arm higher than your shoulder. ? Keep your abdominal and back muscles tight. ? Keep your hips facing the ground. ? Do not arch your back. ? Keep your balance carefully, and do not hold your breath. 4.  Hold for __________ seconds. 5. Slowly return to the starting position and repeat with your right leg and your left arm. Repeat __________ times. Complete this exercise __________times a day. Exercise J: Single leg lower with bent knees 1. Lie on your back on a firm surface. 2. Tense your abdominal muscles and lift your feet off the floor, one foot at a time, so your knees and hips are bent in an "L" shape (at about 90 degrees). ? Your knees should be over your hips and your lower legs should be parallel to the floor. 3. Keeping your  abdominal muscles tense and your knee bent, slowly lower one of your legs so your toe touches the ground. 4. Lift your leg back up to return to the starting position. ? Do not hold your breath. ? Do not let your back arch. Keep your back flat against the ground. 5. Repeat with your other leg. Repeat __________ times. Complete this exercise __________ times a day. Posture and body mechanics  Body mechanics refers to the movements and positions of your body while you do your daily activities. Posture is part of body mechanics. Good posture and healthy body mechanics can help to relieve stress in your body's tissues and joints. Good posture means that your spine is in its natural S-curve position (your spine is neutral), your shoulders are pulled back slightly, and your head is not tipped forward. The following are general guidelines for applying improved posture and body mechanics to your everyday activities. Standing   When standing, keep your spine neutral and your feet about hip-width apart. Keep a slight bend in your knees. Your ears, shoulders, and hips should line up.  When you do a task in which you stand in one place for a long time, place one foot up on a stable object that is 2-4 inches (5-10 cm) high, such as a footstool. This helps keep your spine neutral. Sitting   When sitting, keep your spine neutral and keep your feet flat on the floor. Use a footrest, if necessary, and keep your thighs parallel to the floor. Avoid rounding your shoulders, and avoid tilting your head forward.  When working at a desk or a computer, keep your desk at a height where your hands are slightly lower than your elbows. Slide your chair under your desk so you are close enough to maintain good posture.  When working at a computer, place your monitor at a height where you are looking straight ahead and you do not have to tilt your head forward or downward to look at the screen. Resting   When lying down  and resting, avoid positions that are most painful for you.  If you have pain with activities such as sitting, bending, stooping, or squatting (flexion-based activities), lie in a position in which your body does not bend very much. For example, avoid curling up on your side with your arms and knees near your chest (fetal position).  If you have pain with activities such as standing for a long time or reaching with your arms (extension-based activities), lie with your spine in a neutral position and bend your knees slightly. Try the following positions: ? Lying on your side with a pillow between your knees. ? Lying on your back with a pillow under your knees. Lifting   When lifting objects, keep your feet at least shoulder-width apart and tighten your abdominal muscles.  Bend your knees and hips and keep your spine neutral. It is important  to lift using the strength of your legs, not your back. Do not lock your knees straight out.  Always ask for help to lift heavy or awkward objects. This information is not intended to replace advice given to you by your health care provider. Make sure you discuss any questions you have with your health care provider. Document Released: 06/29/2005 Document Revised: 03/05/2016 Document Reviewed: 04/10/2015 Elsevier Interactive Patient Education  2018 [employer] Inc[employer]  

## 2017-10-13 ENCOUNTER — Telehealth: Payer: Self-pay | Admitting: Emergency Medicine

## 2017-10-13 NOTE — Telephone Encounter (Signed)
Spoke with patient doing better but taking 1 day at a time

## 2018-03-18 ENCOUNTER — Encounter: Payer: Self-pay | Admitting: Internal Medicine

## 2018-03-18 ENCOUNTER — Ambulatory Visit: Payer: 59 | Admitting: Internal Medicine

## 2018-03-18 VITALS — BP 128/60 | HR 85 | Temp 98.0°F | Ht 77.0 in | Wt 300.0 lb

## 2018-03-18 DIAGNOSIS — G4733 Obstructive sleep apnea (adult) (pediatric): Secondary | ICD-10-CM | POA: Diagnosis not present

## 2018-03-18 DIAGNOSIS — E785 Hyperlipidemia, unspecified: Secondary | ICD-10-CM

## 2018-03-18 DIAGNOSIS — Z23 Encounter for immunization: Secondary | ICD-10-CM | POA: Diagnosis not present

## 2018-03-18 DIAGNOSIS — R7303 Prediabetes: Secondary | ICD-10-CM

## 2018-03-18 DIAGNOSIS — E559 Vitamin D deficiency, unspecified: Secondary | ICD-10-CM | POA: Diagnosis not present

## 2018-03-18 DIAGNOSIS — Z1159 Encounter for screening for other viral diseases: Secondary | ICD-10-CM

## 2018-03-18 DIAGNOSIS — M545 Low back pain, unspecified: Secondary | ICD-10-CM

## 2018-03-18 DIAGNOSIS — G8929 Other chronic pain: Secondary | ICD-10-CM | POA: Insufficient documentation

## 2018-03-18 DIAGNOSIS — Z0184 Encounter for antibody response examination: Secondary | ICD-10-CM

## 2018-03-18 DIAGNOSIS — Z1329 Encounter for screening for other suspected endocrine disorder: Secondary | ICD-10-CM | POA: Diagnosis not present

## 2018-03-18 DIAGNOSIS — Z1389 Encounter for screening for other disorder: Secondary | ICD-10-CM

## 2018-03-18 DIAGNOSIS — Z113 Encounter for screening for infections with a predominantly sexual mode of transmission: Secondary | ICD-10-CM

## 2018-03-18 DIAGNOSIS — Z0000 Encounter for general adult medical examination without abnormal findings: Secondary | ICD-10-CM | POA: Diagnosis not present

## 2018-03-18 NOTE — Addendum Note (Signed)
Addended by: Quentin Ore on: 03/18/2018 01:46 PM   Modules accepted: Orders

## 2018-03-18 NOTE — Patient Instructions (Addendum)
sch fasting labs and Xray of your low back next week  We will refer you Dr. Laruth Bouchard for sleep study    Sleep Apnea Sleep apnea is a condition in which breathing pauses or becomes shallow during sleep. Episodes of sleep apnea usually last 10 seconds or longer, and they may occur as many as 20 times an hour. Sleep apnea disrupts your sleep and keeps your body from getting the rest that it needs. This condition can increase your risk of certain health problems, including:  Heart attack.  Stroke.  Obesity.  Diabetes.  Heart failure.  Irregular heartbeat.  There are three kinds of sleep apnea:  Obstructive sleep apnea. This kind is caused by a blocked or collapsed airway.  Central sleep apnea. This kind happens when the part of the brain that controls breathing does not send the correct signals to the muscles that control breathing.  Mixed sleep apnea. This is a combination of obstructive and central sleep apnea.  What are the causes? The most common cause of this condition is a collapsed or blocked airway. An airway can collapse or become blocked if:  Your throat muscles are abnormally relaxed.  Your tongue and tonsils are larger than normal.  You are overweight.  Your airway is smaller than normal.  What increases the risk? This condition is more likely to develop in people who:  Are overweight.  Smoke.  Have a smaller than normal airway.  Are elderly.  Are male.  Drink alcohol.  Take sedatives or tranquilizers.  Have a family history of sleep apnea.  What are the signs or symptoms? Symptoms of this condition include:  Trouble staying asleep.  Daytime sleepiness and tiredness.  Irritability.  Loud snoring.  Morning headaches.  Trouble concentrating.  Forgetfulness.  Decreased interest in sex.  Unexplained sleepiness.  Mood swings.  Personality changes.  Feelings of depression.  Waking up often during the night to urinate.  Dry  mouth.  Sore throat.  How is this diagnosed? This condition may be diagnosed with:  A medical history.  A physical exam.  A series of tests that are done while you are sleeping (sleep study). These tests are usually done in a sleep lab, but they may also be done at home.  How is this treated? Treatment for this condition aims to restore normal breathing and to ease symptoms during sleep. It may involve managing health issues that can affect breathing, such as high blood pressure or obesity. Treatment may include:  Sleeping on your side.  Using a decongestant if you have nasal congestion.  Avoiding the use of depressants, including alcohol, sedatives, and narcotics.  Losing weight if you are overweight.  Making changes to your diet.  Quitting smoking.  Using a device to open your airway while you sleep, such as: ? An oral appliance. This is a custom-made mouthpiece that shifts your lower jaw forward. ? A continuous positive airway pressure (CPAP) device. This device delivers oxygen to your airway through a mask. ? A nasal expiratory positive airway pressure (EPAP) device. This device has valves that you put into each nostril. ? A bi-level positive airway pressure (BPAP) device. This device delivers oxygen to your airway through a mask.  Surgery if other treatments do not work. During surgery, excess tissue is removed to create a wider airway.  It is important to get treatment for sleep apnea. Without treatment, this condition can lead to:  High blood pressure.  Coronary artery disease.  (Men) An inability to  achieve or maintain an erection (impotence).  Reduced thinking abilities.  Follow these instructions at home:  Make any lifestyle changes that your health care provider recommends.  Eat a healthy, well-balanced diet.  Take over-the-counter and prescription medicines only as told by your health care provider.  Avoid using depressants, including alcohol,  sedatives, and narcotics.  Take steps to lose weight if you are overweight.  If you were given a device to open your airway while you sleep, use it only as told by your health care provider.  Do not use any tobacco products, such as cigarettes, chewing tobacco, and e-cigarettes. If you need help quitting, ask your health care provider.  Keep all follow-up visits as told by your health care provider. This is important. Contact a health care provider if:  The device that you received to open your airway during sleep is uncomfortable or does not seem to be working.  Your symptoms do not improve.  Your symptoms get worse. Get help right away if:  You develop chest pain.  You develop shortness of breath.  You develop discomfort in your back, arms, or stomach.  You have trouble speaking.  You have weakness on one side of your body.  You have drooping in your face. These symptoms may represent a serious problem that is an emergency. Do not wait to see if the symptoms will go away. Get medical help right away. Call your local emergency services (911 in the [place] Do not drive yourself to the hospital. This information is not intended to replace advice given to you by your health care provider. Make sure you discuss any questions you have with your health care provider. Document Released: 06/19/2002 Document Revised: 02/23/2016 Document Reviewed: 04/08/2015 Elsevier Interactive Patient Education  2018 [employer].    Tdap/DTaP Vaccine (Diphtheria, Tetanus, and Pertussis): What You Need to Know 1. Why get vaccinated? Diphtheria, tetanus, and pertussis are serious diseases caused by bacteria. Diphtheria and pertussis are spread from person to person. Tetanus enters the body through cuts or wounds. DIPHTHERIA causes a thick covering in the back of the throat.  It can lead to breathing problems, paralysis, heart failure, and even death.  TETANUS (Lockjaw) causes painful tightening of  the muscles, usually all over the body.  It can lead to "locking" of the jaw so the victim cannot open his mouth or swallow. Tetanus leads to death in up to 2 out of 10 cases.  PERTUSSIS (Whooping Cough) causes coughing spells so bad that it is hard for infants to eat, drink, or breathe. These spells can last for weeks.  It can lead to pneumonia, seizures (jerking and staring spells), brain damage, and death.  Diphtheria, tetanus, and pertussis vaccine (DTaP) can help prevent these diseases. Most children who are vaccinated with DTaP will be protected throughout childhood. Many more children would get these diseases if we stopped vaccinating. DTaP is a safer version of an older vaccine called DTP. DTP is no longer used in the [place]. 2. Who should get DTaP vaccine and when? Children should get 5 doses of DTaP vaccine, one dose at each of the following ages:  2 months  4 months  6 months  15-18 months  4-6 years  DTaP may be given at the same time as other vaccines. 3. Some children should not get DTaP vaccine or should wait  Children with minor illnesses, such as a cold, may be vaccinated. But children who are moderately or severely ill should usually wait  until they recover before getting DTaP vaccine.  Any child who had a life-threatening allergic reaction after a dose of DTaP should not get another dose.  Any child who suffered a brain or nervous system disease within 7 days after a dose of DTaP should not get another dose.  Talk with your doctor if your child: ? had a seizure or collapsed after a dose of DTaP, ? cried non-stop for 3 hours or more after a dose of DTaP, ? had a fever over 105F after a dose of DTaP. Ask your doctor for more information. Some of these children should not get another dose of pertussis vaccine, but may get a vaccine without pertussis, called DT. 4. Older children and adults DTaP is not licensed for adolescents, adults, or children 7 years  of age and older. But older people still need protection. A vaccine called Tdap is similar to DTaP. A single dose of Tdap is recommended for people 11 through 31 years of age. Another vaccine, called Td, protects against tetanus and diphtheria, but not pertussis. It is recommended every 10 years. There are separate Vaccine Information Statements for these vaccines. 5. What are the risks from DTaP vaccine? Getting diphtheria, tetanus, or pertussis disease is much riskier than getting DTaP vaccine. However, a vaccine, like any medicine, is capable of causing serious problems, such as severe allergic reactions. The risk of DTaP vaccine causing serious harm, or death, is extremely small. Mild problems (common)  Fever (up to about 1 child in 4)  Redness or swelling where the shot was given (up to about 1 child in 4)  Soreness or tenderness where the shot was given (up to about 1 child in 4) These problems occur more often after the 4th and 5th doses of the DTaP series than after earlier doses. Sometimes the 4th or 5th dose of DTaP vaccine is followed by swelling of the entire arm or leg in which the shot was given, lasting 1-7 days (up to about 1 child in 30). Other mild problems include:  Fussiness (up to about 1 child in 3)  Tiredness or poor appetite (up to about 1 child in 10)  Vomiting (up to about 1 child in 50) These problems generally occur 1-3 days after the shot. Moderate problems (uncommon)  Seizure (jerking or staring) (about 1 child out of 14,000)  Non-stop crying, for 3 hours or more (up to about 1 child out of 1,000)  High fever, over 105F (about 1 child out of 16,000) Severe problems (very rare)  Serious allergic reaction (less than 1 out of a million doses)  Several other severe problems have been reported after DTaP vaccine. These include: ? Long-term seizures, coma, or lowered consciousness ? Permanent brain damage. These are so rare it is hard to tell if they are  caused by the vaccine. Controlling fever is especially important for children who have had seizures, for any reason. It is also important if another family member has had seizures. You can reduce fever and pain by giving your child an aspirin-free pain reliever when the shot is given, and for the next 24 hours, following the package instructions. 6. What if there is a serious reaction? What should I look for? Look for anything that concerns you, such as signs of a severe allergic reaction, very high fever, or behavior changes. Signs of a severe allergic reaction can include hives, swelling of the face and throat, difficulty breathing, a fast heartbeat, dizziness, and weakness. These would start  a few minutes to a few hours after the vaccination. What should I do?  If you think it is a severe allergic reaction or other emergency that can't wait, call 9-1-1 or get the person to the nearest hospital. Otherwise, call your doctor.  Afterward, the reaction should be reported to the Vaccine Adverse Event Reporting System (VAERS). Your doctor might file this report, or you can do it yourself through the VAERS web site at www.vaers.LAgents.no, or by calling 1-(726)095-2211. ? VAERS is only for reporting reactions. They do not give medical advice. 7. The National Vaccine Injury Compensation Program The [employer] Vaccine Injury Compensation Program (VICP) is a federal program that was created to compensate people who may have been injured by certain vaccines. Persons who believe they may have been injured by a vaccine can learn about the program and about filing a claim by calling 1-240 304 8138 or visiting the VICP website at SpiritualWord.at. 8. How can I learn more?  Ask your doctor.  Call your local or state health department.  Contact the Centers for Disease Control and Prevention (CDC): ? Call (604)195-8373 (1-800-CDC-INFO) or ? Visit CDC's website at PicCapture.uy CDC DTaP  Vaccine (Diphtheria, Tetanus, and Pertussis) VIS (11/26/05) This information is not intended to replace advice given to you by your health care provider. Make sure you discuss any questions you have with your health care provider. Document Released: 04/26/2006 Document Revised: 03/19/2016 Document Reviewed: 03/19/2016 Elsevier Interactive Patient Education  2017 [employer].

## 2018-03-18 NOTE — Progress Notes (Signed)
Chief Complaint  Patient presents with  . Transitions Of Care   TOC  1. C/o low back pain intermittently esp with heavy lifting trash and linen at work last Xray s 03/2015 unable to see he requests letter from work to reduce instances of heavy lifting today. He saw employee health 09/2017 and another visit since due to flare of back pain currently not taking any medication pain is 0/10 today tried brace and unknown medication in the past   2. HLD/prediabetes need to repeat labs he will start healthy diet choices and exercise  3. C/o snoring and stopping breathing refer to lung MD sleep study   Review of Systems  Constitutional: Negative for weight loss.  HENT: Negative for hearing loss.   Eyes: Negative for blurred vision.  Respiratory: Negative for shortness of breath.   Cardiovascular: Negative for chest pain.  Gastrointestinal: Negative for diarrhea.  Musculoskeletal: Positive for back pain.  Skin: Negative for rash.  Neurological: Negative for headaches.  Psychiatric/Behavioral: Negative for depression.   Past Medical History:  Diagnosis Date  . Chicken pox    Past Surgical History:  Procedure Laterality Date  . FEMUR FRACTURE SURGERY  2008   No family history on file. Social History   Socioeconomic History  . Marital status: Single    Spouse name: Not on file  . Number of children: Not on file  . Years of education: Not on file  . Highest education level: Not on file  Occupational History  . Not on file  Social Needs  . Financial resource strain: Not on file  . Food insecurity:    Worry: Not on file    Inability: Not on file  . Transportation needs:    Medical: Not on file    Non-medical: Not on file  Tobacco Use  . Smoking status: Never Smoker  . Smokeless tobacco: Never Used  Substance and Sexual Activity  . Alcohol use: Yes    Alcohol/week: 0.0 - 5.0 standard drinks  . Drug use: No  . Sexual activity: Not Currently    Partners: Female  Lifestyle  .  Physical activity:    Days per week: Not on file    Minutes per session: Not on file  . Stress: Not on file  Relationships  . Social connections:    Talks on phone: Not on file    Gets together: Not on file    Attends religious service: Not on file    Active member of club or organization: Not on file    Attends meetings of clubs or organizations: Not on file    Relationship status: Not on file  . Intimate partner violence:    Fear of current or ex partner: Not on file    Emotionally abused: Not on file    Physically abused: Not on file    Forced sexual activity: Not on file  Other Topics Concern  . Not on file  Social History Narrative  . Not on file   No outpatient medications have been marked as taking for the 03/18/18 encounter (Office Visit) with McLean-Scocuzza, Pasty Spillers, MD.   No Known Allergies No results found for this or any previous visit (from the past 2160 hour(s)). Objective  Body mass index is 35.57 kg/m. Wt Readings from Last 3 Encounters:  03/18/18 300 lb (136.1 kg)  10/08/17 295 lb (133.8 kg)  10/06/17 295 lb (133.8 kg)   Temp Readings from Last 3 Encounters:  03/18/18 98 F (36.7 C) (Oral)  10/08/17 98.4 F (36.9 C)  10/06/17 98.4 F (36.9 C)   BP Readings from Last 3 Encounters:  03/18/18 128/60  10/08/17 126/84  10/06/17 120/80   Pulse Readings from Last 3 Encounters:  03/18/18 85  10/08/17 91  10/06/17 89    Physical Exam  Constitutional: He is oriented to person, place, and time. Vital signs are normal. He appears well-developed and well-nourished. He is cooperative.  HENT:  Head: Normocephalic and atraumatic.  Mouth/Throat: Oropharynx is clear and moist and mucous membranes are normal.  Eyes: Pupils are equal, round, and reactive to light. Conjunctivae are normal.  Cardiovascular: Normal rate, regular rhythm and normal heart sounds.  Pulmonary/Chest: Effort normal and breath sounds normal.  Musculoskeletal:       Lumbar back: He  exhibits tenderness.  Neg str8 leg test b/l  Mild low back pain on exam   Neurological: He is alert and oriented to person, place, and time. Gait normal.  Skin: Skin is warm, dry and intact.  Psychiatric: He has a normal mood and affect. His speech is normal and behavior is normal. Judgment and thought content normal. Cognition and memory are normal.  Nursing note and vitals reviewed.   Assessment   1. Chronic low back pain  2. HLD/prediabetes  3. HM 4. C/w OSA Plan   1. Xray sch future  No heavy lifting >15 lbs rec at work given letter today  2. sch fasting labs  3.  Will get flu shot work 04/2018  Tdap given today  Std check with upcoming labs  4. Referral sleep study Dr. Ardyth Man  Provider: Dr. French Ana McLean-Scocuzza-Internal Medicine

## 2018-03-18 NOTE — Progress Notes (Signed)
Pre visit review using our clinic review tool, if applicable. No additional management support is needed unless otherwise documented below in the visit note. 

## 2018-03-22 ENCOUNTER — Other Ambulatory Visit: Payer: Self-pay | Admitting: Internal Medicine

## 2018-03-22 ENCOUNTER — Other Ambulatory Visit (INDEPENDENT_AMBULATORY_CARE_PROVIDER_SITE_OTHER): Payer: 59

## 2018-03-22 DIAGNOSIS — E559 Vitamin D deficiency, unspecified: Secondary | ICD-10-CM

## 2018-03-22 DIAGNOSIS — R7303 Prediabetes: Secondary | ICD-10-CM | POA: Diagnosis not present

## 2018-03-22 DIAGNOSIS — Z113 Encounter for screening for infections with a predominantly sexual mode of transmission: Secondary | ICD-10-CM

## 2018-03-22 DIAGNOSIS — Z0000 Encounter for general adult medical examination without abnormal findings: Secondary | ICD-10-CM

## 2018-03-22 DIAGNOSIS — Z0184 Encounter for antibody response examination: Secondary | ICD-10-CM | POA: Diagnosis not present

## 2018-03-22 DIAGNOSIS — Z1159 Encounter for screening for other viral diseases: Secondary | ICD-10-CM | POA: Diagnosis not present

## 2018-03-22 DIAGNOSIS — Z1329 Encounter for screening for other suspected endocrine disorder: Secondary | ICD-10-CM

## 2018-03-22 DIAGNOSIS — E785 Hyperlipidemia, unspecified: Secondary | ICD-10-CM

## 2018-03-22 DIAGNOSIS — Z1389 Encounter for screening for other disorder: Secondary | ICD-10-CM

## 2018-03-22 LAB — COMPREHENSIVE METABOLIC PANEL
ALK PHOS: 42 U/L (ref 39–117)
ALT: 40 U/L (ref 0–53)
AST: 20 U/L (ref 0–37)
Albumin: 4.2 g/dL (ref 3.5–5.2)
BUN: 12 mg/dL (ref 6–23)
CHLORIDE: 102 meq/L (ref 96–112)
CO2: 29 meq/L (ref 19–32)
Calcium: 9.4 mg/dL (ref 8.4–10.5)
Creatinine, Ser: 1.1 mg/dL (ref 0.40–1.50)
GFR: 100.1 mL/min (ref >60.00)
GLUCOSE: 99 mg/dL (ref 70–99)
Potassium: 4.1 mEq/L (ref 3.5–5.1)
SODIUM: 137 meq/L (ref 135–145)
Total Bilirubin: 0.6 mg/dL (ref 0.2–1.2)
Total Protein: 7.1 g/dL (ref 6.0–8.3)

## 2018-03-22 LAB — CBC WITH DIFFERENTIAL/PLATELET
Basophils Absolute: 0 10*3/uL (ref 0.0–0.1)
Basophils Relative: 1.1 % (ref 0.0–3.0)
EOS PCT: 4.3 % (ref 0.0–5.0)
Eosinophils Absolute: 0.2 10*3/uL (ref 0.0–0.7)
HCT: 42.3 % (ref 39.0–52.0)
Hemoglobin: 13.9 g/dL (ref 13.0–17.0)
LYMPHS ABS: 1.9 10*3/uL (ref 0.7–4.0)
Lymphocytes Relative: 43 % (ref 12.0–46.0)
MCHC: 33 g/dL (ref 30.0–36.0)
MCV: 81.4 fl (ref 78.0–100.0)
MONO ABS: 0.4 10*3/uL (ref 0.1–1.0)
MONOS PCT: 8.5 % (ref 3.0–12.0)
NEUTROS ABS: 1.9 10*3/uL (ref 1.4–7.7)
NEUTROS PCT: 43.1 % (ref 43.0–77.0)
PLATELETS: 325 10*3/uL (ref 150.0–400.0)
RBC: 5.2 Mil/uL (ref 4.22–5.81)
RDW: 13.9 % (ref 11.5–15.5)
WBC: 4.4 10*3/uL (ref 4.0–10.5)

## 2018-03-22 LAB — LIPID PANEL
Cholesterol: 144 mg/dL (ref 0–200)
HDL: 26.7 mg/dL — AB (ref >39.00)
LDL Cholesterol: 88 mg/dL (ref 0–99)
NonHDL: 117.25
TRIGLYCERIDES: 145 mg/dL (ref 0.0–149.0)
Total CHOL/HDL Ratio: 5
VLDL: 29 mg/dL (ref 0.0–40.0)

## 2018-03-22 LAB — HEMOGLOBIN A1C: HEMOGLOBIN A1C: 6.1 % (ref 4.6–6.5)

## 2018-03-22 LAB — VITAMIN D 25 HYDROXY (VIT D DEFICIENCY, FRACTURES): VITD: 8.25 ng/mL — ABNORMAL LOW (ref 30.00–100.00)

## 2018-03-22 LAB — T4, FREE: Free T4: 0.75 ng/dL (ref 0.60–1.60)

## 2018-03-22 LAB — TSH: TSH: 2.13 u[IU]/mL (ref 0.35–4.50)

## 2018-03-22 MED ORDER — CHOLECALCIFEROL 1.25 MG (50000 UT) PO CAPS: 50000.0000 [IU] | ORAL_CAPSULE | ORAL | 1 refills | Status: DC

## 2018-03-22 NOTE — Addendum Note (Signed)
Addended by: Warden Fillers on: 03/22/2018 04:51 PM   Modules accepted: Orders

## 2018-03-23 ENCOUNTER — Telehealth: Payer: Self-pay | Admitting: Internal Medicine

## 2018-03-23 ENCOUNTER — Other Ambulatory Visit (HOSPITAL_COMMUNITY)
Admission: RE | Admit: 2018-03-23 | Discharge: 2018-03-23 | Disposition: A | Discharge status: Home / Self Care | Payer: 59 | Source: Ambulatory Visit | Attending: Internal Medicine | Admitting: Internal Medicine

## 2018-03-23 ENCOUNTER — Encounter: Payer: Self-pay | Admitting: Internal Medicine

## 2018-03-23 ENCOUNTER — Other Ambulatory Visit (INDEPENDENT_AMBULATORY_CARE_PROVIDER_SITE_OTHER): Payer: 59

## 2018-03-23 DIAGNOSIS — Z113 Encounter for screening for infections with a predominantly sexual mode of transmission: Secondary | ICD-10-CM | POA: Diagnosis not present

## 2018-03-23 DIAGNOSIS — Z1389 Encounter for screening for other disorder: Secondary | ICD-10-CM | POA: Insufficient documentation

## 2018-03-23 LAB — URINALYSIS, ROUTINE W REFLEX MICROSCOPIC
BILIRUBIN URINE: NEGATIVE
HGB URINE DIPSTICK: NEGATIVE
Ketones, ur: NEGATIVE
Leukocytes, UA: NEGATIVE
Nitrite: NEGATIVE
Specific Gravity, Urine: 1.01 (ref 1.000–1.030)
TOTAL PROTEIN, URINE-UPE24: NEGATIVE
Urine Glucose: NEGATIVE
Urobilinogen, UA: 0.2 (ref 0.0–1.0)
pH: 6 (ref 5.0–8.0)

## 2018-03-23 LAB — HEPATITIS B SURFACE ANTIBODY, QUANTITATIVE: Hepatitis B-Post: 181 m[IU]/mL (ref >=10)

## 2018-03-23 LAB — HSV 2 ANTIBODY, IGG: HSV 2 Glycoprotein G Ab, IgG: <0.9 index

## 2018-03-23 LAB — HSV 1 ANTIBODY, IGG: HSV 1 GLYCOPROTEIN G AB, IGG: 19 {index} — AB

## 2018-03-23 LAB — MEASLES/MUMPS/RUBELLA IMMUNITY
MUMPS IGG: 41.8 [AU]/ml
RUBELLA: 1.27 {index}
RUBEOLA IGG: 81.3 [AU]/ml

## 2018-03-23 LAB — HEPATITIS C ANTIBODY
Hepatitis C Ab: NONREACTIVE
SIGNAL TO CUT-OFF: 0.01 (ref <1.00)

## 2018-03-23 LAB — HEPATITIS B SURFACE ANTIGEN: HEP B S AG: NONREACTIVE

## 2018-03-23 LAB — RPR: RPR Ser Ql: NONREACTIVE

## 2018-03-23 LAB — HIV ANTIBODY (ROUTINE TESTING W REFLEX): HIV 1&2 Ab, 4th Generation: NONREACTIVE

## 2018-03-23 NOTE — Progress Notes (Signed)
Code has been sent to patient email.

## 2018-03-23 NOTE — Telephone Encounter (Signed)
addended work Physicist, medical and rec pt visit HR  He is to get Xray of low back   TMS

## 2018-03-23 NOTE — Telephone Encounter (Signed)
Pt would like to speak to Dr. Jearld Pies. He said he received a phone call from the [employer] that he needs to have more detailed on his paper work. There is something else he wants to speak to her about, pt would not say.

## 2018-03-24 LAB — URINE CYTOLOGY ANCILLARY ONLY
Chlamydia: NEGATIVE
Neisseria Gonorrhea: NEGATIVE

## 2018-04-05 ENCOUNTER — Ambulatory Visit (INDEPENDENT_AMBULATORY_CARE_PROVIDER_SITE_OTHER): Payer: 59

## 2018-04-05 DIAGNOSIS — M545 Low back pain, unspecified: Secondary | ICD-10-CM

## 2018-04-05 DIAGNOSIS — G8929 Other chronic pain: Secondary | ICD-10-CM

## 2018-04-05 DIAGNOSIS — M4186 Other forms of scoliosis, lumbar region: Secondary | ICD-10-CM | POA: Diagnosis not present

## 2018-04-19 ENCOUNTER — Encounter: Payer: Self-pay | Admitting: Internal Medicine

## 2018-04-19 ENCOUNTER — Ambulatory Visit (INDEPENDENT_AMBULATORY_CARE_PROVIDER_SITE_OTHER): Payer: 59 | Admitting: Internal Medicine

## 2018-04-19 VITALS — BP 110/80 | HR 83 | Resp 16 | Ht 76.0 in | Wt 312.0 lb

## 2018-04-19 DIAGNOSIS — G4719 Other hypersomnia: Secondary | ICD-10-CM

## 2018-04-19 DIAGNOSIS — Z23 Encounter for immunization: Secondary | ICD-10-CM

## 2018-04-19 NOTE — Progress Notes (Signed)
[name] Consultation      Assessment and Plan:  Excessive daytime sleepiness. - Symptoms and signs of obstructive sleep apnea. -We will send for sleep study, start on CPAP as appropriate.  Obesity - BMI 38, which may be contributory to sleep apnea. -Discussed weight loss which would be beneficial.  Flu shot given today.   Date: 04/19/2018  MRN# [id number] Jimmy Clark 1985/12/17  Referring Physician:   [name] is a 32 y.o. old male seen in consultation for chief complaint of:    Chief Complaint  Patient presents with  . Consult    referred by T. [name] for eval of sleep apnea.  . Snoring  . excessive daytime fatigue    HPI:   Patient comes in today with complaints of excessive daytime sleepiness.  Elliptically notes that when laying on his back he has trouble sleeping.  He works 2nd shift gets off at 11 pm. He usually goes to bed between 2 AM and 4:30 AM.  It takes him from a few minutes to few hours to fall asleep.  He usually gets out of bed at 9:00.  Epworth score is elevated at 14 today. He will sometimes wake himself up gasping. He notes that this is worse since stopping mariajuana. He notes occasional sleep paralysis, denies sleep walking, or cataplexy. He has been told that he snores loudly, and that he stops breathing in his sleep.  Denies jaw pain, no TMJ.    PMHX:   Past Medical History:  Diagnosis Date  . Chicken pox   . Hyperlipidemia   . Prediabetes    Surgical Hx:  Past Surgical History:  Procedure Laterality Date  . FEMUR FRACTURE SURGERY  2008   Family Hx:  Family History  Family history unknown: Yes   Social Hx:   Social History   Tobacco Use  . Smoking status: Never Smoker  . Smokeless tobacco: Never Used  Substance Use Topics  . Alcohol use: Yes    Alcohol/week: 0.0 - 5.0 standard drinks  . Drug use: No   Medication:    Current Outpatient Medications:  .  Cholecalciferol 50000 units  capsule, Take 1 capsule (50,000 Units total) by mouth once a week. (Patient not taking: Reported on 04/19/2018), Disp: 13 capsule, Rfl: 1   Allergies:  Patient has no known allergies.  Review of Systems: Gen:  Denies  fever, sweats, chills HEENT: Denies blurred vision, double vision. bleeds, sore throat Cvc:  No dizziness, chest pain. Resp:   Denies cough or sputum production, shortness of breath Gi: Denies swallowing difficulty, stomach pain. Gu:  Denies bladder incontinence, burning urine Ext:   No Joint pain, stiffness. Skin: No skin rash,  hives  Endoc:  No polyuria, polydipsia. Psych: No depression, insomnia. Other:  All other systems were reviewed with the patient and were negative other that what is mentioned in the HPI.   Physical Examination:   VS: BP 110/80 (BP Location: Left Arm, Cuff Size: Large)   Pulse 83   Resp 16   Ht 6\' 4"  (1.93 m)   Wt (!) 312 lb (141.5 kg)   SpO2 100%   BMI 37.98 kg/m   General Appearance: No distress  Neuro:without focal findings,  speech normal,  HEENT: PERRLA, EOM intact.  [place] 3.  Pulmonary: normal breath sounds, No wheezing.  CardiovascularNormal S1,S2.  No m/r/g.   Abdomen: Benign, Soft, non-tender. Renal:  No costovertebral tenderness  GU:  No performed at this time.  Endoc: No evident thyromegaly, no signs of acromegaly. Skin:   warm, no rashes, no ecchymosis  Extremities: normal, no cyanosis, clubbing.  Other findings:    LABORATORY PANEL:   CBC No results for input(s): WBC, HGB, HCT, PLT in the last 168 hours. ------------------------------------------------------------------------------------------------------------------  Chemistries  No results for input(s): NA, K, CL, CO2, GLUCOSE, BUN, CREATININE, CALCIUM, MG, AST, ALT, ALKPHOS, BILITOT in the last 168 hours.  Invalid input(s): GFRCGP ------------------------------------------------------------------------------------------------------------------  Cardiac  Enzymes No results for input(s): TROPONINI in the last 168 hours. ------------------------------------------------------------  RADIOLOGY:  No results found.     Thank  you for the consultation and for allowing [facility] to assist in the care of your patient. Our recommendations are noted above.  Please contact us if we can be of further service.   Wells Guiles, M.D., F.C.C.P.  Board Certified in Internal Medicine, Pulmonary Medicine, Critical Care Medicine, and Sleep Medicine.   Pulmonary and Critical Care Office Number: [phone]   04/19/2018

## 2018-04-19 NOTE — Patient Instructions (Signed)

## 2018-04-25 DIAGNOSIS — G4733 Obstructive sleep apnea (adult) (pediatric): Secondary | ICD-10-CM | POA: Diagnosis not present

## 2018-05-06 DIAGNOSIS — G4733 Obstructive sleep apnea (adult) (pediatric): Secondary | ICD-10-CM | POA: Diagnosis not present

## 2018-05-09 ENCOUNTER — Telehealth: Payer: Self-pay | Admitting: *Deleted

## 2018-05-09 ENCOUNTER — Telehealth: Payer: Self-pay | Admitting: Internal Medicine

## 2018-05-09 DIAGNOSIS — G4733 Obstructive sleep apnea (adult) (pediatric): Secondary | ICD-10-CM

## 2018-05-09 NOTE — Telephone Encounter (Signed)
Patient needs letter for proof of getting flu shot at office visit for [employer].  Please call asap patient may be sent home until he has documentation.

## 2018-05-09 NOTE — Telephone Encounter (Signed)
Immunization history and office note printed for patient and placed at front desk.

## 2018-05-09 NOTE — Telephone Encounter (Signed)
Pt aware Orders placed Nothing further needed. 

## 2018-06-17 ENCOUNTER — Telehealth: Payer: Self-pay | Admitting: Internal Medicine

## 2018-06-17 NOTE — Telephone Encounter (Signed)
If job rejected letter I am not sure I can do much else but write another letter to request accomodations -does he want that?  We can also do MRI low back to further work up, does he want that?  He could consider physical therapy and based on MRI findings possibly another referral to back specialist   TMS

## 2018-06-17 NOTE — Telephone Encounter (Signed)
Copied from CRM [id number]. Topic: Quick Communication - See Telephone Encounter >> Jun 17, 2018 10:34 AM Jens SomMedley, Jennifer A wrote: CRM for notification. See Telephone encounter for: 06/17/18.  Patient is calling because his job placed him back on regular duty.  A drs note was submitted for him to only do his job on weekends and holidays.  At this time, the patient is requesting how Dr. Kennith Centerracey could assist him with returning back to light duty.  His workers comp claim was denied. The letter that he read from Workers Comp said for him to return to regular duty on 05/25/18.  However, his employer returned him back to regular duty  06/16/18. Patient is looking to return back to light duty. Please advise [phone]

## 2018-06-20 NOTE — Telephone Encounter (Signed)
Yes to the MRI and letter.  No to the pt.

## 2018-06-22 ENCOUNTER — Encounter: Payer: Self-pay | Admitting: Internal Medicine

## 2018-06-22 ENCOUNTER — Other Ambulatory Visit: Payer: Self-pay | Admitting: Internal Medicine

## 2018-06-22 DIAGNOSIS — M545 Low back pain, unspecified: Secondary | ICD-10-CM

## 2018-06-22 DIAGNOSIS — M419 Scoliosis, unspecified: Secondary | ICD-10-CM

## 2018-06-22 NOTE — Telephone Encounter (Signed)
Letter ready for pick up

## 2018-06-22 NOTE — Telephone Encounter (Signed)
Letter has been placed up front.  

## 2018-07-15 ENCOUNTER — Ambulatory Visit: Payer: 59

## 2018-07-26 ENCOUNTER — Other Ambulatory Visit: Payer: Self-pay | Admitting: *Deleted

## 2018-07-26 DIAGNOSIS — G4719 Other hypersomnia: Secondary | ICD-10-CM

## 2018-08-23 ENCOUNTER — Ambulatory Visit: Admission: RE | Admit: 2018-08-23 | Payer: 59 | Source: Ambulatory Visit

## 2018-09-16 ENCOUNTER — Encounter: Payer: Self-pay | Admitting: Internal Medicine

## 2018-09-16 ENCOUNTER — Ambulatory Visit: Payer: 59 | Admitting: Internal Medicine

## 2018-09-16 VITALS — BP 130/76 | HR 81 | Temp 98.5°F | Ht 76.0 in | Wt 304.2 lb

## 2018-09-16 DIAGNOSIS — R7303 Prediabetes: Secondary | ICD-10-CM

## 2018-09-16 DIAGNOSIS — Z1329 Encounter for screening for other suspected endocrine disorder: Secondary | ICD-10-CM | POA: Diagnosis not present

## 2018-09-16 DIAGNOSIS — G4733 Obstructive sleep apnea (adult) (pediatric): Secondary | ICD-10-CM

## 2018-09-16 DIAGNOSIS — M545 Low back pain, unspecified: Secondary | ICD-10-CM

## 2018-09-16 DIAGNOSIS — Z1389 Encounter for screening for other disorder: Secondary | ICD-10-CM

## 2018-09-16 DIAGNOSIS — M2141 Flat foot [pes planus] (acquired), right foot: Secondary | ICD-10-CM | POA: Diagnosis not present

## 2018-09-16 DIAGNOSIS — E559 Vitamin D deficiency, unspecified: Secondary | ICD-10-CM | POA: Diagnosis not present

## 2018-09-16 DIAGNOSIS — Z0000 Encounter for general adult medical examination without abnormal findings: Secondary | ICD-10-CM

## 2018-09-16 DIAGNOSIS — M2140 Flat foot [pes planus] (acquired), unspecified foot: Secondary | ICD-10-CM | POA: Insufficient documentation

## 2018-09-16 DIAGNOSIS — M2142 Flat foot [pes planus] (acquired), left foot: Secondary | ICD-10-CM | POA: Diagnosis not present

## 2018-09-16 DIAGNOSIS — G8929 Other chronic pain: Secondary | ICD-10-CM

## 2018-09-16 MED ORDER — CHOLECALCIFEROL 1.25 MG (50000 UT) PO CAPS: 50000.0000 [IU] | ORAL_CAPSULE | ORAL | 1 refills | Status: DC

## 2018-09-16 NOTE — Progress Notes (Signed)
Pre visit review using our clinic review tool, if applicable. No additional management support is needed unless otherwise documented below in the visit note. 

## 2018-09-16 NOTE — Patient Instructions (Addendum)
Reconsider cpap machine   [facility]  Or  [facility] [address] Idelle Jo   Flat Feet, Adult  Normally, a foot has a curve, called an arch, on its inner side. The arch creates a gap between the foot and the ground. Flat feet is a common condition in which one or both feet do not have an arch. What are the causes? This condition may be caused by:  Failure of a normal arch to develop during childhood.  An injury to tendons and ligaments in the foot, such as to the tendon that supports the arch (posterior tibial tendon).  Loose tendons or ligaments in the foot.  A wearing down of the arch over time.  Injury to bones in the foot.  An abnormality in the bones of the foot, called tarsal coalition. This happens when two or more bones in the foot are joined together (fused) before birth. What increases the risk? This condition is more likely to develop in:  Females.  Adults age 30 or older.  People who: ? Have a family history of flat feet. ? Have a history of childhood flexible flatfoot. ? Are obese. ? Have diabetes. ? Have high blood pressure. ? Participate in high-impact sports. ? Have inflammatory arthritis. ? Have a history of broken (fractured) or dislocated bones in the foot. What are the signs or symptoms? Symptoms of this condition include:  Pain or tightness along the bottom of the foot.  Foot pain that gets worse with activity.  Swelling of the inner side of the foot.  Swelling of the ankle.  Pain on the outer side of the ankle.  Changes in the way that you walk (gait).  Pronation. This is when the foot and ankle lean inward when you are standing.  Bony bumps on the top or inner side of the foot. How is this diagnosed? This condition is diagnosed with a physical exam of your foot and ankle. Your health care provider may also:  Look at your shoes for patterns of wear on the soles.  Order imaging tests, such as X-rays, a CT scan, or an MRI.  Refer you to a  health care provider who specializes in feet (podiatrist) or a physical therapist. How is this treated? This condition may be treated with:  Stretching exercises or physical therapy. This helps to increase range of motion and relieve pain.  A shoe insert (orthotic). This helps to support the arch of your foot. Orthotics can be purchased from a store or can be custom-made by your health care provider.  Wearing shoes with appropriate arch support. This is especially important for athletes.  Medicines. These may be prescribed to relieve pain.  An ankle brace, boot, or cast. These may be used to relieve pressure on your foot. You may be given crutches if walking is painful.  Surgery. This may be done to improve the alignment of your foot. This is only needed if your posterior tibial tendon is torn or if you have tarsal coalition. Follow these instructions at home: Activity  Do any exercises as told by your health care provider.  If an activity causes pain, avoid it or try to find another activity that does not cause pain. General instructions  Wear orthotics and appropriate shoes as told by your health care provider.  Take over-the-counter and prescription medicines only as told by your health care provider.  Wear an ankle brace, boot, or cast as told by your health care provider.  Use crutches as told  by your health care provider.  Keep all follow-up visits as told by your health care provider. This is important. How is this prevented? To prevent the condition from getting worse:  Wear comfortable, supportive shoes that are appropriate for your activities.  Maintain a healthy weight.  Stay active in a way that your health care provider recommends. This will help to keep your feet flexible and strong.  Manage long-term (chronic) health conditions, such as diabetes, high blood pressure, and inflammatory arthritis.  Work with a health care provider if you have concerns about your  feet or shoes. Contact a health care provider if:  You have pain in your foot or lower leg that gets worse or does not improve with medicine.  You have pain or difficulty when walking.  You have problems with your orthotics. Summary  Flat feet is a common condition in which one or both feet do not have a curve, called an arch, on the inner side.  Your health care provider may recommend a shoe insert (orthotic) or shoes with the appropriate arch support.  Other treatments may include stretching exercises or physical therapy, medicines to relieve pain, and wearing an ankle brace, boot, or cast.  Surgery may be done if you have a tear in the tendon that supports your arch (posterior tibial tendon) or if two or more of your foot bones were joined together (fused)  before birth (tarsal coalition). This information is not intended to replace advice given to you by your health care provider. Make sure you discuss any questions you have with your health care provider. Document Released: 04/26/2009 Document Revised: 09/09/2016 Document Reviewed: 09/09/2016 Elsevier Interactive Patient Education  2019 Elsevier Inc.   Vitamin D Deficiency Vitamin D deficiency is when your body does not have enough vitamin D. Vitamin D is important to your body for many reasons:  It helps the body to absorb two important minerals, called calcium and phosphorus.  It plays a role in bone health.  It may help to prevent some diseases, such as diabetes and multiple sclerosis.  It plays a role in muscle function, including heart function. You can get vitamin D by:  Eating foods that naturally contain vitamin D.  Eating or drinking milk or other dairy products that have vitamin D added to them.  Taking a vitamin D supplement or a multivitamin supplement that contains vitamin D.  Being in the sun. Your body naturally makes vitamin D when your skin is exposed to sunlight. Your body changes the sunlight into a  form of the vitamin that the body can use. If vitamin D deficiency is severe, it can cause a condition in which your bones become soft. In adults, this condition is called osteomalacia. In children, this condition is called rickets. What are the causes? Vitamin D deficiency may be caused by:  Not eating enough foods that contain vitamin D.  Not getting enough sun exposure.  Having certain digestive system diseases that make it difficult for your body to absorb vitamin D. These diseases include Crohn disease, chronic pancreatitis, and cystic fibrosis.  Having a surgery in which a part of the stomach or a part of the small intestine is removed.  Being obese.  Having chronic kidney disease or liver disease. What increases the risk? This condition is more likely to develop in:  Older people.  People who do not spend much time outdoors.  People who live in a long-term care facility.  People who have had broken bones.  People with weak or thin bones (osteoporosis).  People who have a disease or condition that changes how the body absorbs vitamin D.  People who have dark skin.  People who take certain medicines, such as steroid medicines or certain seizure medicines.  People who are overweight or obese. What are the signs or symptoms? In mild cases of vitamin D deficiency, there may not be any symptoms. If the condition is severe, symptoms may include:  Bone pain.  Muscle pain.  Falling often.  Broken bones caused by a minor injury. How is this diagnosed? This condition is usually diagnosed with a blood test. How is this treated? Treatment for this condition may depend on what caused the condition. Treatment options include:  Taking vitamin D supplements.  Taking a calcium supplement. Your health care provider will suggest what dose is best for you. Follow these instructions at home:  Take medicines and supplements only as told by your health care provider.  Eat  foods that contain vitamin D. Choices include: ? Fortified dairy products, cereals, or juices. Fortified means that vitamin D has been added to the food. Check the label on the package to be sure. ? Fatty fish, such as salmon or trout. ? Eggs. ? Oysters.  Do not use a tanning bed.  Maintain a healthy weight. Lose weight, if needed.  Keep all follow-up visits as told by your health care provider. This is important. Contact a health care provider if:  Your symptoms do not go away.  You feel like throwing up (nausea) or you throw up (vomit).  You have fewer bowel movements than usual or it is difficult for you to have a bowel movement (constipation). This information is not intended to replace advice given to you by your health care provider. Make sure you discuss any questions you have with your health care provider. Document Released: 09/21/2011 Document Revised: 12/11/2015 Document Reviewed: 11/14/2014 Elsevier Interactive Patient Education  2019 Elsevier Inc.   Sleep Apnea Sleep apnea is a condition in which breathing pauses or becomes shallow during sleep. Episodes of sleep apnea usually last 10 seconds or longer, and they may occur as many as 20 times an hour. Sleep apnea disrupts your sleep and keeps your body from getting the rest that it needs. This condition can increase your risk of certain health problems, including:  Heart attack.  Stroke.  Obesity.  Diabetes.  Heart failure.  Irregular heartbeat. There are three kinds of sleep apnea:  Obstructive sleep apnea. This kind is caused by a blocked or collapsed airway.  Central sleep apnea. This kind happens when the part of the brain that controls breathing does not send the correct signals to the muscles that control breathing.  Mixed sleep apnea. This is a combination of obstructive and central sleep apnea. What are the causes? The most common cause of this condition is a collapsed or blocked airway. An airway can  collapse or become blocked if:  Your throat muscles are abnormally relaxed.  Your tongue and tonsils are larger than normal.  You are overweight.  Your airway is smaller than normal. What increases the risk? This condition is more likely to develop in people who:  Are overweight.  Smoke.  Have a smaller than normal airway.  Are elderly.  Are male.  Drink alcohol.  Take sedatives or tranquilizers.  Have a family history of sleep apnea. What are the signs or symptoms? Symptoms of this condition include:  Trouble staying asleep.  Daytime sleepiness and  tiredness.  Irritability.  Loud snoring.  Morning headaches.  Trouble concentrating.  Forgetfulness.  Decreased interest in sex.  Unexplained sleepiness.  Mood swings.  Personality changes.  Feelings of depression.  Waking up often during the night to urinate.  Dry mouth.  Sore throat. How is this diagnosed? This condition may be diagnosed with:  A medical history.  A physical exam.  A series of tests that are done while you are sleeping (sleep study). These tests are usually done in a sleep lab, but they may also be done at home. How is this treated? Treatment for this condition aims to restore normal breathing and to ease symptoms during sleep. It may involve managing health issues that can affect breathing, such as high blood pressure or obesity. Treatment may include:  Sleeping on your side.  Using a decongestant if you have nasal congestion.  Avoiding the use of depressants, including alcohol, sedatives, and narcotics.  Losing weight if you are overweight.  Making changes to your diet.  Quitting smoking.  Using a device to open your airway while you sleep, such as: ? An oral appliance. This is a custom-made mouthpiece that shifts your lower jaw forward. ? A continuous positive airway pressure (CPAP) device. This device delivers oxygen to your airway through a mask. ? A nasal  expiratory positive airway pressure (EPAP) device. This device has valves that you put into each nostril. ? A bi-level positive airway pressure (BPAP) device. This device delivers oxygen to your airway through a mask.  Surgery if other treatments do not work. During surgery, excess tissue is removed to create a wider airway. It is important to get treatment for sleep apnea. Without treatment, this condition can lead to:  High blood pressure.  Coronary artery disease.  (Men) An inability to achieve or maintain an erection (impotence).  Reduced thinking abilities. Follow these instructions at home:  Make any lifestyle changes that your health care provider recommends.  Eat a healthy, well-balanced diet.  Take over-the-counter and prescription medicines only as told by your health care provider.  Avoid using depressants, including alcohol, sedatives, and narcotics.  Take steps to lose weight if you are overweight.  If you were given a device to open your airway while you sleep, use it only as told by your health care provider.  Do not use any tobacco products, such as cigarettes, chewing tobacco, and e-cigarettes. If you need help quitting, ask your health care provider.  Keep all follow-up visits as told by your health care provider. This is important. Contact a health care provider if:  The device that you received to open your airway during sleep is uncomfortable or does not seem to be working.  Your symptoms do not improve.  Your symptoms get worse. Get help right away if:  You develop chest pain.  You develop shortness of breath.  You develop discomfort in your back, arms, or stomach.  You have trouble speaking.  You have weakness on one side of your body.  You have drooping in your face. These symptoms may represent a serious problem that is an emergency. Do not wait to see if the symptoms will go away. Get medical help right away. Call your local emergency  services (911 in the [place] Do not drive yourself to the hospital. This information is not intended to replace advice given to you by your health care provider. Make sure you discuss any questions you have with your health care provider. Document Released: 06/19/2002 Document Revised:  01/25/2017 Document Reviewed: 04/08/2015 Elsevier Interactive Patient Education  [employer].

## 2018-09-16 NOTE — Progress Notes (Signed)
Chief Complaint  Patient presents with  . Follow-up   F/u  1. Prediabetes 6.1 2 2. Chronic back pain improved for now  3. Vitamin D def did not pick up D3 yet  4. Moderate OSA did not get cpap and does not want for now  5. C/o flat feet and intermittent foot pain and possibly heel pain    Review of Systems  Constitutional: Negative for weight loss.  HENT: Negative for hearing loss.   Eyes: Negative for blurred vision.  Respiratory: Negative for shortness of breath.   Cardiovascular: Negative for chest pain.  Gastrointestinal: Negative for abdominal pain.  Musculoskeletal: Positive for joint pain. Negative for back pain.  Skin: Negative for rash.  Neurological: Negative for headaches.  Psychiatric/Behavioral: Negative for depression.   Past Medical History:  Diagnosis Date  . Chicken pox   . Hyperlipidemia   . Prediabetes    Past Surgical History:  Procedure Laterality Date  . FEMUR FRACTURE SURGERY  2008   Family History  Family history unknown: Yes   Social History   Socioeconomic History  . Marital status: Single    Spouse name: Not on file  . Number of children: Not on file  . Years of education: Not on file  . Highest education level: Not on file  Occupational History  . Not on file  Social Needs  . Financial resource strain: Not on file  . Food insecurity:    Worry: Not on file    Inability: Not on file  . Transportation needs:    Medical: Not on file    Non-medical: Not on file  Tobacco Use  . Smoking status: Never Smoker  . Smokeless tobacco: Never Used  Substance and Sexual Activity  . Alcohol use: Yes    Alcohol/week: 0.0 - 5.0 standard drinks  . Drug use: No  . Sexual activity: Not Currently    Partners: Female  Lifestyle  . Physical activity:    Days per week: Not on file    Minutes per session: Not on file  . Stress: Not on file  Relationships  . Social connections:    Talks on phone: Not on file    Gets together: Not on file   Attends religious service: Not on file    Active member of club or organization: Not on file    Attends meetings of clubs or organizations: Not on file    Relationship status: Not on file  . Intimate partner violence:    Fear of current or ex partner: Not on file    Emotionally abused: Not on file    Physically abused: Not on file    Forced sexual activity: Not on file  Other Topics Concern  . Not on file  Social History Narrative   Attended [employer]   Works [employer]    Current Meds  Medication Sig  . Cholecalciferol 1.25 MG (50000 UT) capsule Take 1 capsule (50,000 Units total) by mouth once a week.  . [DISCONTINUED] Cholecalciferol 50000 units capsule Take 1 capsule (50,000 Units total) by mouth once a week.   No Known Allergies No results found for this or any previous visit (from the past 2160 hour(s)). Objective  Body mass index is 37.03 kg/m. Wt Readings from Last 3 Encounters:  09/16/18 (!) 304 lb 3.2 oz (138 kg)  04/19/18 (!) 312 lb (141.5 kg)  03/18/18 300 lb (136.1 kg)   Temp Readings from Last 3 Encounters:  09/16/18 98.5 F (36.9 C) (Oral)  03/18/18 98 F (36.7 C) (Oral)  10/08/17 98.4 F (36.9 C)   BP Readings from Last 3 Encounters:  09/16/18 130/76  04/19/18 110/80  03/18/18 128/60   Pulse Readings from Last 3 Encounters:  09/16/18 81  04/19/18 83  03/18/18 85    Physical Exam Vitals signs and nursing note reviewed.  Constitutional:      Appearance: Normal appearance. He is well-developed and well-groomed. He is obese.  HENT:     Head: Normocephalic and atraumatic.     Nose: Nose normal.     Mouth/Throat:     Mouth: Mucous membranes are moist.     Pharynx: Oropharynx is clear.  Eyes:     Conjunctiva/sclera: Conjunctivae normal.     Pupils: Pupils are equal, round, and reactive to light.  Cardiovascular:     Rate and Rhythm: Normal rate and regular rhythm.     Heart sounds: Normal heart sounds. No murmur.  Pulmonary:     Effort: Pulmonary effort  is normal.     Breath sounds: Normal breath sounds.  Skin:    General: Skin is warm and dry.  Neurological:     General: No focal deficit present.     Mental Status: He is alert and oriented to person, place, and time. Mental status is at baseline.     Gait: Gait normal.  Psychiatric:        Attention and Perception: Attention and perception normal.        Mood and Affect: Mood and affect normal.        Speech: Speech normal.        Behavior: Behavior normal. Behavior is cooperative.        Thought Content: Thought content normal.        Cognition and Memory: Cognition normal.        Judgment: Judgment normal.     Assessment   1. Prediabetes 2. Chronic back pain improved for now and b/l feet pain with flat feet 3. Vitamin D def  4. Moderate osa  5. HM Plan   1. rec healthy diet and exercise  2. Consider podiatry in future for now insoles, ice stretching  3. D3 50K weekly x 6 months  4. Declines cpap for now  5.  Will get flu shot work 2019  Tdap utd  MMR immune   STD testing had   Provider: Dr. Olivia Mackie McLean-Scocuzza-[facility]

## 2018-09-20 ENCOUNTER — Ambulatory Visit: Payer: 59

## 2018-12-27 ENCOUNTER — Ambulatory Visit: Payer: 59

## 2019-03-23 ENCOUNTER — Other Ambulatory Visit (INDEPENDENT_AMBULATORY_CARE_PROVIDER_SITE_OTHER): Payer: 59

## 2019-03-23 ENCOUNTER — Other Ambulatory Visit: Payer: Self-pay

## 2019-03-23 DIAGNOSIS — Z1329 Encounter for screening for other suspected endocrine disorder: Secondary | ICD-10-CM

## 2019-03-23 DIAGNOSIS — E559 Vitamin D deficiency, unspecified: Secondary | ICD-10-CM | POA: Diagnosis not present

## 2019-03-23 DIAGNOSIS — Z0000 Encounter for general adult medical examination without abnormal findings: Secondary | ICD-10-CM

## 2019-03-23 DIAGNOSIS — Z1389 Encounter for screening for other disorder: Secondary | ICD-10-CM | POA: Diagnosis not present

## 2019-03-23 DIAGNOSIS — R7303 Prediabetes: Secondary | ICD-10-CM | POA: Diagnosis not present

## 2019-03-23 LAB — CBC WITH DIFFERENTIAL/PLATELET
Basophils Absolute: 0.1 10*3/uL (ref 0.0–0.1)
Basophils Relative: 0.8 % (ref 0.0–3.0)
Eosinophils Absolute: 0.2 10*3/uL (ref 0.0–0.7)
Eosinophils Relative: 2.8 % (ref 0.0–5.0)
HCT: 45.6 % (ref 39.0–52.0)
Hemoglobin: 14.5 g/dL (ref 13.0–17.0)
Lymphocytes Relative: 43.5 % (ref 12.0–46.0)
Lymphs Abs: 2.7 10*3/uL (ref 0.7–4.0)
MCHC: 31.7 g/dL (ref 30.0–36.0)
MCV: 83.7 fl (ref 78.0–100.0)
Monocytes Absolute: 0.5 10*3/uL (ref 0.1–1.0)
Monocytes Relative: 8.9 % (ref 3.0–12.0)
Neutro Abs: 2.7 10*3/uL (ref 1.4–7.7)
Neutrophils Relative %: 44 % (ref 43.0–77.0)
Platelets: 294 10*3/uL (ref 150.0–400.0)
RBC: 5.46 Mil/uL (ref 4.22–5.81)
RDW: 14.3 % (ref 11.5–15.5)
WBC: 6.1 10*3/uL (ref 4.0–10.5)

## 2019-03-23 LAB — COMPREHENSIVE METABOLIC PANEL
ALT: 36 U/L (ref 0–53)
AST: 22 U/L (ref 0–37)
Albumin: 4.4 g/dL (ref 3.5–5.2)
Alkaline Phosphatase: 60 U/L (ref 39–117)
BUN: 18 mg/dL (ref 6–23)
CO2: 28 mEq/L (ref 19–32)
Calcium: 9.4 mg/dL (ref 8.4–10.5)
Chloride: 102 mEq/L (ref 96–112)
Creatinine, Ser: 1.23 mg/dL (ref 0.40–1.50)
GFR: 82.27 mL/min (ref >60.00)
Glucose, Bld: 90 mg/dL (ref 70–99)
Potassium: 4.2 mEq/L (ref 3.5–5.1)
Sodium: 137 mEq/L (ref 135–145)
Total Bilirubin: 0.7 mg/dL (ref 0.2–1.2)
Total Protein: 7 g/dL (ref 6.0–8.3)

## 2019-03-23 LAB — VITAMIN D 25 HYDROXY (VIT D DEFICIENCY, FRACTURES): VITD: 21.88 ng/mL — ABNORMAL LOW (ref 30.00–100.00)

## 2019-03-23 LAB — HEMOGLOBIN A1C: Hgb A1c MFr Bld: 5.9 % (ref 4.6–6.5)

## 2019-03-23 LAB — LIPID PANEL
Cholesterol: 172 mg/dL (ref 0–200)
HDL: 25.1 mg/dL — ABNORMAL LOW (ref >39.00)
LDL Cholesterol: 112 mg/dL — ABNORMAL HIGH (ref 0–99)
NonHDL: 147.08
Total CHOL/HDL Ratio: 7
Triglycerides: 174 mg/dL — ABNORMAL HIGH (ref 0.0–149.0)
VLDL: 34.8 mg/dL (ref 0.0–40.0)

## 2019-03-23 LAB — TSH: TSH: 1.95 u[IU]/mL (ref 0.35–4.50)

## 2019-03-24 LAB — URINALYSIS, ROUTINE W REFLEX MICROSCOPIC
Bilirubin Urine: NEGATIVE
Glucose, UA: NEGATIVE
Hgb urine dipstick: NEGATIVE
Ketones, ur: NEGATIVE
Leukocytes,Ua: NEGATIVE
Nitrite: NEGATIVE
Protein, ur: NEGATIVE
Specific Gravity, Urine: 1.026 (ref 1.001–1.03)
pH: 5.5 (ref 5.0–8.0)

## 2019-03-30 DIAGNOSIS — H5213 Myopia, bilateral: Secondary | ICD-10-CM | POA: Diagnosis not present

## 2019-03-31 ENCOUNTER — Ambulatory Visit (INDEPENDENT_AMBULATORY_CARE_PROVIDER_SITE_OTHER): Payer: 59 | Admitting: Internal Medicine

## 2019-03-31 ENCOUNTER — Other Ambulatory Visit: Payer: Self-pay

## 2019-03-31 ENCOUNTER — Encounter: Payer: Self-pay | Admitting: Internal Medicine

## 2019-03-31 VITALS — Ht 76.0 in | Wt 292.0 lb

## 2019-03-31 DIAGNOSIS — E559 Vitamin D deficiency, unspecified: Secondary | ICD-10-CM

## 2019-03-31 DIAGNOSIS — Z0000 Encounter for general adult medical examination without abnormal findings: Secondary | ICD-10-CM

## 2019-03-31 DIAGNOSIS — M79671 Pain in right foot: Secondary | ICD-10-CM

## 2019-03-31 DIAGNOSIS — M79672 Pain in left foot: Secondary | ICD-10-CM

## 2019-03-31 DIAGNOSIS — E785 Hyperlipidemia, unspecified: Secondary | ICD-10-CM | POA: Diagnosis not present

## 2019-03-31 DIAGNOSIS — R7303 Prediabetes: Secondary | ICD-10-CM

## 2019-03-31 NOTE — Progress Notes (Signed)
Virtual Visit via Video Note  I connected with Jimmy Clark   on 03/31/19 at  9:30 AM EDT by a video enabled telemedicine application and verified that I am speaking with the correct person using two identifiers.  Location patient: home Location provider:work or home office Persons participating in the virtual visit: patient, provider  I discussed the limitations of evaluation and management by telemedicine and the availability of in person appointments. The patient expressed understanding and agreed to proceed.   HPI: 1. Annual with labs A1C 5.9 and HLD but was not fasting  2. 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 General: wt 292-303 lbs  HEENT: no sore throat  CV: no chest pain  Lungs: no sob  GI: no ab pain  GU: no issues  MSK: +b/l foot pain  Neuro: no h/a  Skin: no issues  Psych: no anxiety/depression    Past Medical History:  Diagnosis Date  . Chicken pox   . Hyperlipidemia   . Prediabetes     Past Surgical History:  Procedure Laterality Date  . FEMUR FRACTURE SURGERY  2008    Family History  Family history unknown: Yes    SOCIAL HX:  Attended [employer] Works [employer]   Current Outpatient Medications:  .  Cholecalciferol (VITAMIN D-3) 125 MCG (5000 UT) TABS, Take by mouth., Disp: , Rfl:   EXAM:  VITALS per patient if applicable:  GENERAL: alert, oriented, appears well and in no acute distress  HEENT: atraumatic, conjunttiva clear, no obvious abnormalities on inspection of external nose and ears  NECK: normal movements of the head and neck  LUNGS: on inspection no signs of respiratory distress, breathing rate appears normal, no obvious gross SOB, gasping or wheezing  CV: no obvious cyanosis  MS: moves all visible extremities without noticeable abnormality  PSYCH/NEURO: pleasant and cooperative, no obvious depression or anxiety, speech and thought processing  grossly intact  ASSESSMENT AND PLAN:  Discussed the following assessment and plan:  Annual physical exam Will get flu shot work 2019 2020 but does not want today  Tdap utd  MMR immune  rec exercise and healthy diet choices  STD testing had  Eye md saw 03/30/19  Vitamin D deficiency rec D3 5000 iu daily   Prediabetes Exercise and healthy diet chocise   Hyperlipidemia, unspecified hyperlipidemia type -see above   B/l foot pain    -we discussed possible serious and likely etiologies, options for evaluation and workup, limitations of telemedicine visit vs in person visit, treatment, treatment risks and precautions. Pt prefers to treat via telemedicine empirically rather then risking or undertaking an in person visit at this moment. Patient agrees to seek prompt in person care if worsening, new symptoms arise, or if is not improving with treatment.   I discussed the assessment and treatment plan with the patient. The patient was provided an opportunity to ask questions and all were answered. The patient agreed with the plan and demonstrated an understanding of the instructions.   The patient was advised to call back or seek an in-person evaluation if the symptoms worsen or if the condition fails to improve as anticipated.  Time spent 15 minutes  Delorise Jackson, MD

## 2019-05-02 ENCOUNTER — Ambulatory Visit (INDEPENDENT_AMBULATORY_CARE_PROVIDER_SITE_OTHER): Payer: 59

## 2019-05-02 ENCOUNTER — Other Ambulatory Visit: Payer: Self-pay

## 2019-05-02 ENCOUNTER — Encounter: Payer: Self-pay | Admitting: Podiatry

## 2019-05-02 ENCOUNTER — Ambulatory Visit: Payer: 59

## 2019-05-02 ENCOUNTER — Ambulatory Visit: Payer: 59 | Admitting: Podiatry

## 2019-05-02 DIAGNOSIS — M722 Plantar fascial fibromatosis: Secondary | ICD-10-CM

## 2019-05-02 DIAGNOSIS — M2140 Flat foot [pes planus] (acquired), unspecified foot: Secondary | ICD-10-CM

## 2019-05-02 MED ORDER — DICLOFENAC SODIUM 75 MG PO TBEC: 75.0000 mg | DELAYED_RELEASE_TABLET | Freq: Two times a day (BID) | ORAL | 1 refills | Status: AC

## 2019-05-05 NOTE — Progress Notes (Signed)
   Subjective:  33 y.o. male presenting today as a new patient with a chief complaint of bilateral foot pain, right worse than left, that began 8-9 months ago. He states it feels like he is walking on a bruise. Excessive walking increases the pain. Icing and massaging the feet have been helping to provide minimal temporary relief. Patient is here for further evaluation and treatment.   Past Medical History:  Diagnosis Date  . Chicken pox   . Hyperlipidemia   . Prediabetes        Objective/Physical Exam General: The patient is alert and oriented x3 in no acute distress.  Dermatology: Skin is warm, dry and supple bilateral lower extremities. Negative for open lesions or macerations.  Vascular: Palpable pedal pulses bilaterally. No edema or erythema noted. Capillary refill within normal limits.  Neurological: Epicritic and protective threshold grossly intact bilaterally.   Musculoskeletal Exam: Range of motion within normal limits to all pedal and ankle joints bilateral. Muscle strength 5/5 in all groups bilateral.  Upon weightbearing there is a medial longitudinal arch collapse bilaterally. Remove foot valgus noted to the bilateral lower extremities with excessive pronation upon mid stance. Pain on palpation noted to the posterior tibial tendon of the bilateral feet. Tenderness to palpation to the plantar aspect of the bilateral heels along the plantar fascia.   Radiographic Exam:  Normal osseous mineralization. Joint spaces preserved. No fracture/dislocation/boney destruction.   Pes planus noted on radiographic exam lateral views. Decreased calcaneal inclination and metatarsal declination angle is noted. Anterior break in the cyma line noted on lateral views. Medial talar head to deviation noted on AP radiograph.   Assessment: 1. pes planus bilateral 2. Posterior tibial tendinitis bilateral  3. Plantar fasciitis bilateral    Plan of Care:  1. Patient was evaluated. X-Rays  reviewed.  2. Appointment with Liliane Channel, Pedorthist, for custom molded orthotics.  3. Recommended good shoe gear.  4. Prescription for Diclofenac provided to patient. 5. Return to clinic as needed.   [facility].    Edrick Kins, DPM [facility]  Dr. Edrick Kins, [address]                                        [place], [place] [address]                Office [phone]  Fax [phone]

## 2019-05-17 ENCOUNTER — Other Ambulatory Visit: Payer: 59 | Admitting: Orthotics

## 2019-05-17 ENCOUNTER — Other Ambulatory Visit: Payer: Self-pay

## 2019-05-26 ENCOUNTER — Telehealth: Payer: Self-pay

## 2019-05-26 NOTE — Telephone Encounter (Signed)
Patient has been scheduled for next Tuesday

## 2019-05-26 NOTE — Telephone Encounter (Signed)
Copied from [place] [id number]. Topic: General - Other >> May 26, 2019 11:25 AM Keene Breath wrote: Reason for CRM: Patient called to ask the nurse what is the process for getting a test for a STD.  Should the patient schedule with the lab.  Please call to explain at [phone]

## 2019-05-29 ENCOUNTER — Other Ambulatory Visit: Payer: Self-pay

## 2019-05-30 ENCOUNTER — Ambulatory Visit: Payer: 59 | Admitting: Internal Medicine

## 2019-05-30 ENCOUNTER — Encounter: Payer: Self-pay | Admitting: Internal Medicine

## 2019-05-30 ENCOUNTER — Other Ambulatory Visit (HOSPITAL_COMMUNITY)
Admission: RE | Admit: 2019-05-30 | Discharge: 2019-05-30 | Disposition: A | Discharge status: Home / Self Care | Payer: 59 | Source: Ambulatory Visit | Attending: Internal Medicine | Admitting: Internal Medicine

## 2019-05-30 VITALS — BP 118/72 | HR 76 | Temp 97.2°F | Ht 76.0 in | Wt 300.6 lb

## 2019-05-30 DIAGNOSIS — Z113 Encounter for screening for infections with a predominantly sexual mode of transmission: Secondary | ICD-10-CM | POA: Diagnosis not present

## 2019-05-30 DIAGNOSIS — Z202 Contact with and (suspected) exposure to infections with a predominantly sexual mode of transmission: Secondary | ICD-10-CM | POA: Diagnosis not present

## 2019-05-30 MED ORDER — CEFTRIAXONE SODIUM 250 MG IJ SOLR
250.0000 mg | Freq: Once | INTRAMUSCULAR | Status: AC
  Administered 2019-05-30: 250 mg via INTRAMUSCULAR

## 2019-05-30 MED ORDER — AZITHROMYCIN 500 MG PO TABS: ORAL_TABLET | ORAL | 0 refills | Status: AC

## 2019-05-30 NOTE — Addendum Note (Signed)
Addended by: Elpidio Galea T on: 05/30/2019 04:36 PM   Modules accepted: Orders

## 2019-05-30 NOTE — Patient Instructions (Addendum)
Take 4 pills of azithromycin 2000 mg x 1 dose at pharmacy  Given rocephin 250 mg daily   Blood pressure goal <130/<80   Check your blood pressure at work or home to make sure ok gola <130/<80    Low-Sodium Eating Plan Sodium, which is an element that makes up salt, helps you maintain a healthy balance of fluids in your body. Too much sodium can increase your blood pressure and cause fluid and waste to be held in your body. Your health care provider or dietitian may recommend following this plan if you have high blood pressure (hypertension), kidney disease, liver disease, or heart failure. Eating less sodium can help lower your blood pressure, reduce swelling, and protect your heart, liver, and kidneys. What are tips for following this plan? General guidelines  Most people on this plan should limit their sodium intake to 1,500-2,000 mg (milligrams) of sodium each day. Reading food labels   The Nutrition Facts label lists the amount of sodium in one serving of the food. If you eat more than one serving, you must multiply the listed amount of sodium by the number of servings.  Choose foods with less than 140 mg of sodium per serving.  Avoid foods with 300 mg of sodium or more per serving. Shopping  Look for lower-sodium products, often labeled as "low-sodium" or "no salt added."  Always check the sodium content even if foods are labeled as "unsalted" or "no salt added".  Buy fresh foods. ? Avoid canned foods and premade or frozen meals. ? Avoid canned, cured, or processed meats  Buy breads that have less than 80 mg of sodium per slice. Cooking  Eat more home-cooked food and less restaurant, buffet, and fast food.  Avoid adding salt when cooking. Use salt-free seasonings or herbs instead of table salt or sea salt. Check with your health care provider or pharmacist before using salt substitutes.  Cook with plant-based oils, such as canola, sunflower, or olive oil. Meal  planning  When eating at a restaurant, ask that your food be prepared with less salt or no salt, if possible.  Avoid foods that contain MSG (monosodium glutamate). MSG is sometimes added to [place] food, bouillon, and some canned foods. What foods are recommended? The items listed may not be a complete list. Talk with your dietitian about what dietary choices are best for you. Grains Low-sodium cereals, including oats, puffed wheat and rice, and shredded wheat. Low-sodium crackers. Unsalted rice. Unsalted pasta. Low-sodium bread. Whole-grain breads and whole-grain pasta. Vegetables Fresh or frozen vegetables. "No salt added" canned vegetables. "No salt added" tomato sauce and paste. Low-sodium or reduced-sodium tomato and vegetable juice. Fruits Fresh, frozen, or canned fruit. Fruit juice. Meats and other protein foods Fresh or frozen (no salt added) meat, poultry, seafood, and fish. Low-sodium canned tuna and salmon. Unsalted nuts. Dried peas, beans, and lentils without added salt. Unsalted canned beans. Eggs. Unsalted nut butters. Dairy Milk. Soy milk. Cheese that is naturally low in sodium, such as ricotta cheese, fresh mozzarella, or Swiss cheese Low-sodium or reduced-sodium cheese. Cream cheese. Yogurt. Fats and oils Unsalted butter. Unsalted margarine with no trans fat. Vegetable oils such as canola or olive oils. Seasonings and other foods Fresh and dried herbs and spices. Salt-free seasonings. Low-sodium mustard and ketchup. Sodium-free salad dressing. Sodium-free light mayonnaise. Fresh or refrigerated horseradish. Lemon juice. Vinegar. Homemade, reduced-sodium, or low-sodium soups. Unsalted popcorn and pretzels. Low-salt or salt-free chips. What foods are not recommended? The items listed may not  be a complete list. Talk with your dietitian about what dietary choices are best for you. Grains Instant hot cereals. Bread stuffing, pancake, and biscuit mixes. Croutons. Seasoned rice or  pasta mixes. Noodle soup cups. Boxed or frozen macaroni and cheese. Regular salted crackers. Self-rising flour. Vegetables Sauerkraut, pickled vegetables, and relishes. Olives. [place] fries. Onion rings. Regular canned vegetables (not low-sodium or reduced-sodium). Regular canned tomato sauce and paste (not low-sodium or reduced-sodium). Regular tomato and vegetable juice (not low-sodium or reduced-sodium). Frozen vegetables in sauces. Meats and other protein foods Meat or fish that is salted, canned, smoked, spiced, or pickled. Bacon, ham, sausage, hotdogs, corned beef, chipped beef, packaged lunch meats, salt pork, jerky, pickled herring, anchovies, regular canned tuna, sardines, salted nuts. Dairy Processed cheese and cheese spreads. Cheese curds. Blue cheese. Feta cheese. String cheese. Regular cottage cheese. Buttermilk. Canned milk. Fats and oils Salted butter. Regular margarine. Ghee. Bacon fat. Seasonings and other foods Onion salt, garlic salt, seasoned salt, table salt, and sea salt. Canned and packaged gravies. Worcestershire sauce. Tartar sauce. Barbecue sauce. Teriyaki sauce. Soy sauce, including reduced-sodium. Steak sauce. Fish sauce. Oyster sauce. Cocktail sauce. Horseradish that you find on the shelf. Regular ketchup and mustard. Meat flavorings and tenderizers. Bouillon cubes. Hot sauce and Tabasco sauce. Premade or packaged marinades. Premade or packaged taco seasonings. Relishes. Regular salad dressings. Salsa. Potato and tortilla chips. Corn chips and puffs. Salted popcorn and pretzels. Canned or dried soups. Pizza. Frozen entrees and pot pies. Summary  Eating less sodium can help lower your blood pressure, reduce swelling, and protect your heart, liver, and kidneys.  Most people on this plan should limit their sodium intake to 1,500-2,000 mg (milligrams) of sodium each day.  Canned, boxed, and frozen foods are high in sodium. Restaurant foods, fast foods, and pizza are also  very high in sodium. You also get sodium by adding salt to food.  Try to cook at home, eat more fresh fruits and vegetables, and eat less fast food, canned, processed, or prepared foods. This information is not intended to replace advice given to you by your health care provider. Make sure you discuss any questions you have with your health care provider. Document Released: 12/19/2001 Document Revised: 06/11/2017 Document Reviewed: 06/22/2016 Elsevier Patient Education  2020 Elsevier Inc.  DASH Eating Plan DASH stands for "Dietary Approaches to Stop Hypertension." The DASH eating plan is a healthy eating plan that has been shown to reduce high blood pressure (hypertension). It may also reduce your risk for type 2 diabetes, heart disease, and stroke. The DASH eating plan may also help with weight loss. What are tips for following this plan?  General guidelines  Avoid eating more than 2,300 mg (milligrams) of salt (sodium) a day. If you have hypertension, you may need to reduce your sodium intake to 1,500 mg a day.  Limit alcohol intake to no more than 1 drink a day for nonpregnant women and 2 drinks a day for men. One drink equals 12 oz of beer, 5 oz of wine, or 1 oz of hard liquor.  Work with your health care provider to maintain a healthy body weight or to lose weight. Ask what an ideal weight is for you.  Get at least 30 minutes of exercise that causes your heart to beat faster (aerobic exercise) most days of the week. Activities may include walking, swimming, or biking.  Work with your health care provider or diet and nutrition specialist (dietitian) to adjust your eating plan to your  individual calorie needs. Reading food labels   Check food labels for the amount of sodium per serving. Choose foods with less than 5 percent of the Daily Value of sodium. Generally, foods with less than 300 mg of sodium per serving fit into this eating plan.  To find whole grains, look for the word  "whole" as the first word in the ingredient list. Shopping  Buy products labeled as "low-sodium" or "no salt added."  Buy fresh foods. Avoid canned foods and premade or frozen meals. Cooking  Avoid adding salt when cooking. Use salt-free seasonings or herbs instead of table salt or sea salt. Check with your health care provider or pharmacist before using salt substitutes.  Do not fry foods. Cook foods using healthy methods such as baking, boiling, grilling, and broiling instead.  Cook with heart-healthy oils, such as olive, canola, soybean, or sunflower oil. Meal planning  Eat a balanced diet that includes: ? 5 or more servings of fruits and vegetables each day. At each meal, try to fill half of your plate with fruits and vegetables. ? Up to 6-8 servings of whole grains each day. ? Less than 6 oz of lean meat, poultry, or fish each day. A 3-oz serving of meat is about the same size as a deck of cards. One egg equals 1 oz. ? 2 servings of low-fat dairy each day. ? A serving of nuts, seeds, or beans 5 times each week. ? Heart-healthy fats. Healthy fats called Omega-3 fatty acids are found in foods such as flaxseeds and coldwater fish, like sardines, salmon, and mackerel.  Limit how much you eat of the following: ? Canned or prepackaged foods. ? Food that is high in trans fat, such as fried foods. ? Food that is high in saturated fat, such as fatty meat. ? Sweets, desserts, sugary drinks, and other foods with added sugar. ? Full-fat dairy products.  Do not salt foods before eating.  Try to eat at least 2 vegetarian meals each week.  Eat more home-cooked food and less restaurant, buffet, and fast food.  When eating at a restaurant, ask that your food be prepared with less salt or no salt, if possible. What foods are recommended? The items listed may not be a complete list. Talk with your dietitian about what dietary choices are best for you. Grains Whole-grain or whole-wheat  bread. Whole-grain or whole-wheat pasta. Brown rice. [place]. Bulgur. Whole-grain and low-sodium cereals. Pita bread. Low-fat, low-sodium crackers. Whole-wheat flour tortillas. Vegetables Fresh or frozen vegetables (raw, steamed, roasted, or grilled). Low-sodium or reduced-sodium tomato and vegetable juice. Low-sodium or reduced-sodium tomato sauce and tomato paste. Low-sodium or reduced-sodium canned vegetables. Fruits All fresh, dried, or frozen fruit. Canned fruit in natural juice (without added sugar). Meat and other protein foods Skinless chicken or [place]. Ground chicken or [place]. Pork with fat trimmed off. Fish and seafood. Egg whites. Dried beans, peas, or lentils. Unsalted nuts, nut butters, and seeds. Unsalted canned beans. Lean cuts of beef with fat trimmed off. Low-sodium, lean deli meat. Dairy Low-fat (1%) or fat-free (skim) milk. Fat-free, low-fat, or reduced-fat cheeses. Nonfat, low-sodium ricotta or cottage cheese. Low-fat or nonfat yogurt. Low-fat, low-sodium cheese. Fats and oils Soft margarine without trans fats. Vegetable oil. Low-fat, reduced-fat, or light mayonnaise and salad dressings (reduced-sodium). Canola, safflower, olive, soybean, and sunflower oils. Avocado. Seasoning and other foods Herbs. Spices. Seasoning mixes without salt. Unsalted popcorn and pretzels. Fat-free sweets. What foods are not recommended? The items listed may not be  a complete list. Talk with your dietitian about what dietary choices are best for you. Grains Baked goods made with fat, such as croissants, muffins, or some breads. Dry pasta or rice meal packs. Vegetables Creamed or fried vegetables. Vegetables in a cheese sauce. Regular canned vegetables (not low-sodium or reduced-sodium). Regular canned tomato sauce and paste (not low-sodium or reduced-sodium). Regular tomato and vegetable juice (not low-sodium or reduced-sodium). Rosita FirePickles. Olives. Fruits Canned fruit in a light or heavy  syrup. Fried fruit. Fruit in cream or butter sauce. Meat and other protein foods Fatty cuts of meat. Ribs. Fried meat. Tomasa BlaseBacon. Sausage. Bologna and other processed lunch meats. Salami. Fatback. Hotdogs. Bratwurst. Salted nuts and seeds. Canned beans with added salt. Canned or smoked fish. Whole eggs or egg yolks. Chicken or [place] with skin. Dairy Whole or 2% milk, cream, and half-and-half. Whole or full-fat cream cheese. Whole-fat or sweetened yogurt. Full-fat cheese. Nondairy creamers. Whipped toppings. Processed cheese and cheese spreads. Fats and oils Butter. Stick margarine. Lard. Shortening. Ghee. Bacon fat. Tropical oils, such as coconut, palm kernel, or palm oil. Seasoning and other foods Salted popcorn and pretzels. Onion salt, garlic salt, seasoned salt, table salt, and sea salt. Worcestershire sauce. Tartar sauce. Barbecue sauce. Teriyaki sauce. Soy sauce, including reduced-sodium. Steak sauce. Canned and packaged gravies. Fish sauce. Oyster sauce. Cocktail sauce. Horseradish that you find on the shelf. Ketchup. Mustard. Meat flavorings and tenderizers. Bouillon cubes. Hot sauce and Tabasco sauce. Premade or packaged marinades. Premade or packaged taco seasonings. Relishes. Regular salad dressings. Where to find more information:  National Heart, Lung, and Blood Institute: PopSteam.iswww.nhlbi.nih.gov  American Heart Association: www.heart.org Summary  The DASH eating plan is a healthy eating plan that has been shown to reduce high blood pressure (hypertension). It may also reduce your risk for type 2 diabetes, heart disease, and stroke.  With the DASH eating plan, you should limit salt (sodium) intake to 2,300 mg a day. If you have hypertension, you may need to reduce your sodium intake to 1,500 mg a day.  When on the DASH eating plan, aim to eat more fresh fruits and vegetables, whole grains, lean proteins, low-fat dairy, and heart-healthy fats.  Work with your health care provider or diet and  nutrition specialist (dietitian) to adjust your eating plan to your individual calorie needs. This information is not intended to replace advice given to you by your health care provider. Make sure you discuss any questions you have with your health care provider. Document Released: 06/18/2011 Document Revised: 06/11/2017 Document Reviewed: 06/22/2016 Elsevier Patient Education  2020 [employer]

## 2019-05-30 NOTE — Progress Notes (Signed)
Chief Complaint  Patient presents with  . Exposure to STD   Gonorrhea exposure found out over weekend having unprotected sex and partner male tested + gonorrhea and he wants to be tested     Review of Systems  Constitutional: Negative for weight loss.  HENT: Negative for hearing loss.   Eyes: Negative for blurred vision.  Genitourinary: Negative for dysuria.       Denies penile discharge    Skin: Negative for rash.   Past Medical History:  Diagnosis Date  . Chicken pox   . Hyperlipidemia   . Prediabetes    Past Surgical History:  Procedure Laterality Date  . FEMUR FRACTURE SURGERY  2008   Family History  Family history unknown: Yes   Social History   Socioeconomic History  . Marital status: Single    Spouse name: Not on file  . Number of children: Not on file  . Years of education: Not on file  . Highest education level: Not on file  Occupational History  . Not on file  Social Needs  . Financial resource strain: Not on file  . Food insecurity    Worry: Not on file    Inability: Not on file  . Transportation needs    Medical: Not on file    Non-medical: Not on file  Tobacco Use  . Smoking status: Never Smoker  . Smokeless tobacco: Never Used  Substance and Sexual Activity  . Alcohol use: Yes    Alcohol/week: 0.0 - 5.0 standard drinks  . Drug use: No  . Sexual activity: Not Currently    Partners: Female  Lifestyle  . Physical activity    Days per week: Not on file    Minutes per session: Not on file  . Stress: Not on file  Relationships  . Social Musician on phone: Not on file    Gets together: Not on file    Attends religious service: Not on file    Active member of club or organization: Not on file    Attends meetings of clubs or organizations: Not on file    Relationship status: Not on file  . Intimate partner violence    Fear of current or ex partner: Not on file    Emotionally abused: Not on file    Physically abused: Not on  file    Forced sexual activity: Not on file  Other Topics Concern  . Not on file  Social History Narrative   Attended [employer]    Works [employer]   Current Meds  Medication Sig  . Cholecalciferol (VITAMIN D-3) 125 MCG (5000 UT) TABS Take by mouth.  . diclofenac (VOLTAREN) 75 MG EC tablet Take 1 tablet (75 mg total) by mouth 2 (two) times daily.   No Known Allergies Recent Results (from the past 2160 hour(s))  Hemoglobin A1c     Status: None   Collection Time: 03/23/19  9:28 AM  Result Value Ref Range   Hgb A1c MFr Bld 5.9 4.6 - 6.5 %    Comment: Glycemic Control Guidelines for People with Diabetes:Non Diabetic:  <6%Goal of Therapy: <7%Additional Action Suggested:  >8%   Vitamin D (25 hydroxy)     Status: Abnormal   Collection Time: 03/23/19  9:28 AM  Result Value Ref Range   VITD 21.88 (L) 30.00 - 100.00 ng/mL  Urinalysis, Routine w reflex microscopic     Status: Abnormal   Collection Time: 03/23/19  9:28 AM  Result Value Ref Range   Color, Urine YELLOW YELLOW   APPearance TURBID (A) CLEAR   Specific Gravity, Urine 1.026 1.001 - 1.03   pH 5.5 5.0 - 8.0   Glucose, UA NEGATIVE NEGATIVE   Bilirubin Urine NEGATIVE NEGATIVE   Ketones, ur NEGATIVE NEGATIVE   Hgb urine dipstick NEGATIVE NEGATIVE   Protein, ur NEGATIVE NEGATIVE   Nitrite NEGATIVE NEGATIVE   Leukocytes,Ua NEGATIVE NEGATIVE  TSH     Status: None   Collection Time: 03/23/19  9:28 AM  Result Value Ref Range   TSH 1.95 0.35 - 4.50 uIU/mL  Lipid panel     Status: Abnormal   Collection Time: 03/23/19  9:28 AM  Result Value Ref Range   Cholesterol 172 0 - 200 mg/dL    Comment: ATP III Classification       Desirable:  < 200 mg/dL               Borderline High:  200 - 239 mg/dL          High:  > = 240 mg/dL   Triglycerides 174.0 (H) 0.0 - 149.0 mg/dL    Comment: Normal:  <150 mg/dLBorderline High:  150 - 199 mg/dL   HDL 25.10 (L) >39.00 mg/dL   VLDL 34.8 0.0 - 40.0 mg/dL   LDL Cholesterol 112 (H) 0 - 99 mg/dL    Total CHOL/HDL Ratio 7     Comment:                Men          Women1/2 Average Risk     3.4          3.3Average Risk          5.0          4.42X Average Risk          9.6          7.13X Average Risk          15.0          11.0                       NonHDL 147.08     Comment: NOTE:  Non-HDL goal should be 30 mg/dL higher than patient's LDL goal (i.e. LDL goal of < 70 mg/dL, would have non-HDL goal of < 100 mg/dL)  CBC with Differential/Platelet     Status: None   Collection Time: 03/23/19  9:28 AM  Result Value Ref Range   WBC 6.1 4.0 - 10.5 K/uL   RBC 5.46 4.22 - 5.81 Mil/uL   Hemoglobin 14.5 13.0 - 17.0 g/dL   HCT 45.6 39.0 - 52.0 %   MCV 83.7 78.0 - 100.0 fl   MCHC 31.7 30.0 - 36.0 g/dL   RDW 14.3 11.5 - 15.5 %   Platelets 294.0 150.0 - 400.0 K/uL   Neutrophils Relative % 44.0 43.0 - 77.0 %   Lymphocytes Relative 43.5 12.0 - 46.0 %   Monocytes Relative 8.9 3.0 - 12.0 %   Eosinophils Relative 2.8 0.0 - 5.0 %   Basophils Relative 0.8 0.0 - 3.0 %   Neutro Abs 2.7 1.4 - 7.7 K/uL   Lymphs Abs 2.7 0.7 - 4.0 K/uL   Monocytes Absolute 0.5 0.1 - 1.0 K/uL   Eosinophils Absolute 0.2 0.0 - 0.7 K/uL   Basophils Absolute 0.1 0.0 - 0.1 K/uL  Comprehensive metabolic panel     Status: None  Collection Time: 03/23/19  9:28 AM  Result Value Ref Range   Sodium 137 135 - 145 mEq/L   Potassium 4.2 3.5 - 5.1 mEq/L   Chloride 102 96 - 112 mEq/L   CO2 28 19 - 32 mEq/L   Glucose, Bld 90 70 - 99 mg/dL   BUN 18 6 - 23 mg/dL   Creatinine, Ser 1.611.23 0.40 - 1.50 mg/dL   Total Bilirubin 0.7 0.2 - 1.2 mg/dL   Alkaline Phosphatase 60 39 - 117 U/L   AST 22 0 - 37 U/L   ALT 36 0 - 53 U/L   Total Protein 7.0 6.0 - 8.3 g/dL   Albumin 4.4 3.5 - 5.2 g/dL   Calcium 9.4 8.4 - 09.610.5 mg/dL   GFR 04.5482.27 >09.81>60.00 mL/min   Objective  Body mass index is 36.59 kg/m. Wt Readings from Last 3 Encounters:  05/30/19 (!) 300 lb 9.6 oz (136.4 kg)  03/23/19 292 lb (132.5 kg)  09/16/18 (!) 304 lb 3.2 oz (138 kg)   Temp  Readings from Last 3 Encounters:  05/30/19 (!) 97.2 F (36.2 C) (Skin)  09/16/18 98.5 F (36.9 C) (Oral)  03/18/18 98 F (36.7 C) (Oral)   BP Readings from Last 3 Encounters:  05/30/19 118/72  09/16/18 130/76  04/19/18 110/80   Pulse Readings from Last 3 Encounters:  05/30/19 76  09/16/18 81  04/19/18 83    Physical Exam Vitals signs and nursing note reviewed.  Constitutional:      Appearance: Normal appearance. He is well-developed and well-groomed. He is obese.     Comments: +mask on    HENT:     Head: Normocephalic and atraumatic.  Cardiovascular:     Rate and Rhythm: Normal rate and regular rhythm.     Heart sounds: Normal heart sounds. No murmur.  Pulmonary:     Effort: Pulmonary effort is normal.     Breath sounds: Normal breath sounds.  Skin:    General: Skin is warm and dry.  Neurological:     General: No focal deficit present.     Mental Status: He is alert and oriented to person, place, and time. Mental status is at baseline.     Gait: Gait normal.  Psychiatric:        Attention and Perception: Attention and perception normal.        Mood and Affect: Mood and affect normal.        Speech: Speech normal.        Behavior: Behavior normal. Behavior is cooperative.        Thought Content: Thought content normal.        Cognition and Memory: Cognition and memory normal.        Judgment: Judgment normal.     Assessment  Plan  Screening for venereal disease - Plan: Urine cytology ancillary only([place])  Exposure to gonorrhea - Plan: azithromycin (ZITHROMAX) 500 MG tablet x 4 pills x 1 dose  Rocephin 250 mg x 1  Provider: Dr. French Anaracy McLean-Scocuz[facility]

## 2019-06-01 LAB — URINE CYTOLOGY ANCILLARY ONLY
Chlamydia: NEGATIVE
Comment: NEGATIVE
Comment: NEGATIVE
Comment: NORMAL
Neisseria Gonorrhea: NEGATIVE
Trichomonas: NEGATIVE

## 2019-07-24 ENCOUNTER — Telehealth: Payer: 59 | Admitting: Physician Assistant

## 2019-07-24 DIAGNOSIS — Z1152 Encounter for screening for COVID-19: Secondary | ICD-10-CM

## 2019-07-24 NOTE — Progress Notes (Signed)
E-Visit for [place] has multiple testing sites. For information on our COVID testing locations and hours go to [email or web address]  Testing Information: The COVID-19 Community Testing sites will begin testing BY APPOINTMENT ONLY.  You can schedule online at [email or web address]  If you do not have access to a smart phone or computer you may call [phone] for an appointment.  Testing Locations: Appointment schedule is 8 am to 3:30 pm at all sites  [facility] indoors at [address] [place] [address] [facility]  indoors at [place]. [address], [place], [place] [address] [place] indoors at [address] [place] [address]  Additional testing sites in the Community:  . For [facility] Testing sites in [employer]  FaceUpdate.uy  . For Pop-up testing sites in [place]  BowlDirectory.co.uk  . For Testing sites with regular hours [email or web address].org/[email or web address]  . For [place] [email or web address]  . For [facility] BasicJet.[email or web address]  . For [facility] testing in [place] and [employer] [email or web address]  . For [facility] testing in [facility]   [email or web address].[email or web address]  For  more information about community testing call [phone]   We are enrolling you in our [place] for [place] . Daily you will receive a questionnaire within the [place] website. Our COVID 19 response team will be monitoring your responses daily.  Please quarantine yourself while awaiting your test results. If you develop  fever/cough/breathlessness, please stay home for 10 days with improving symptoms and until you have had 24 hours of no fever (without taking a fever reducer).  You should wear a mask or cloth face covering over your nose and mouth if you must be around other people or animals, including pets (even at home). Try to stay at least 6 feet away from other people. This will protect the people around you.  Please continue good preventive care measures, including:  frequent hand-washing, avoid touching your face, cover coughs/sneezes, stay out of crowds and keep a 6 foot distance from others.  COVID-19 is a respiratory illness with symptoms that are similar to the flu. Symptoms are typically mild to moderate, but there have been cases of severe illness and death due to the virus.   The following symptoms may appear 2-14 days after exposure: . Fever . Cough . Shortness of breath or difficulty breathing . Chills . Repeated shaking with chills . Muscle pain . Headache . Sore throat . New loss of taste or smell . Fatigue . Congestion or runny nose . Nausea or vomiting . Diarrhea  Go to the nearest hospital ED for assessment if fever/cough/breathlessness are severe or illness seems like a threat to life.  It is vitally important that if you feel that you have an infection such as this virus or any other virus that you stay home and away from places where you may spread it to others.  You should avoid contact with people age 45 and older.    Reduce your risk of any infection by using the same precautions used for avoiding the common cold or flu:  Marland Kitchen Wash your hands often with soap and warm water for at least 20 seconds.  If soap and water are not readily available, use an alcohol-based hand sanitizer with at least 60% alcohol.  . If coughing or sneezing, cover your mouth and nose by coughing or sneezing into the elbow areas of your shirt or coat, into a tissue or into  your sleeve (not your hands). . Avoid  shaking hands with others and consider head nods or verbal greetings only. . Avoid touching your eyes, nose, or mouth with unwashed hands.  . Avoid close contact with people who are sick. . Avoid places or events with large numbers of people in one location, like concerts or sporting events. . Carefully consider travel plans you have or are making. . If you are planning any travel outside or inside the [place], visit the CDC's Travelers' Health webpage for the latest health notices. . If you have some symptoms but not all symptoms, continue to monitor at home and seek medical attention if your symptoms worsen. . If you are having a medical emergency, call 911.  HOME CARE . Only take medications as instructed by your medical team. . Drink plenty of fluids and get plenty of rest. . A steam or ultrasonic humidifier can help if you have congestion.   GET HELP RIGHT AWAY IF YOU HAVE EMERGENCY WARNING SIGNS** FOR COVID-19. If you or someone is showing any of these signs seek emergency medical care immediately. Call 911 or proceed to your closest emergency facility if: . You develop worsening high fever. . Trouble breathing . Bluish lips or face . Persistent pain or pressure in the chest . New confusion . Inability to wake or stay awake . You cough up blood. . Your symptoms become more severe  **This list is not all possible symptoms. Contact your medical provider for any symptoms that are sever or concerning to you.  MAKE SURE YOU   Understand these instructions.  Will watch your condition.  Will get help right away if you are not doing well or get worse.  Your e-visit answers were reviewed by a board certified advanced clinical practitioner to complete your personal care plan.  Depending on the condition, your plan could have included both over the counter or prescription medications.  If there is a problem please reply once you have received a response from your provider.  Your safety is  important to [place].  If you have drug allergies check your prescription carefully.    You can use MyChart to ask questions about today's visit, request a non-urgent call back, or ask for a work or school excuse for 24 hours related to this e-Visit. If it has been greater than 24 hours you will need to follow up with your provider, or enter a new e-Visit to address those concerns. You will get an e-mail in the next two days asking about your experience.  I hope that your e-visit has been valuable and will speed your recovery. Thank you for using e-visits.   Greater than 5 minutes, yet less than 10 minutes of time have been spent researching, coordinating and implementing care for this patient today.

## 2019-07-25 ENCOUNTER — Ambulatory Visit: Payer: 59 | Attending: Internal Medicine

## 2019-07-25 DIAGNOSIS — Z20822 Contact with and (suspected) exposure to covid-19: Secondary | ICD-10-CM

## 2019-07-27 LAB — NOVEL CORONAVIRUS, NAA: SARS-CoV-2, NAA: NOT DETECTED

## 2020-01-19 IMAGING — DX DG LUMBAR SPINE COMPLETE 4+V
5 series · 5 of 5 positions shown · non-contrast
Comparison: None.

CLINICAL DATA: Chronic lumbago

EXAM:
LUMBAR SPINE - COMPLETE 4+ VIEW

[lumbar spine ap]
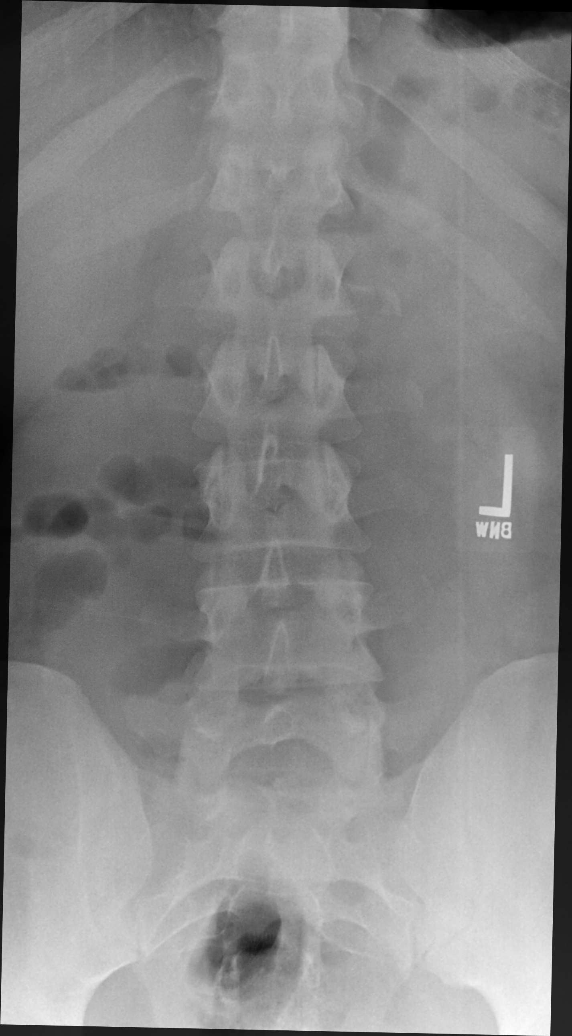

[lumbar spine obl (oblique)]
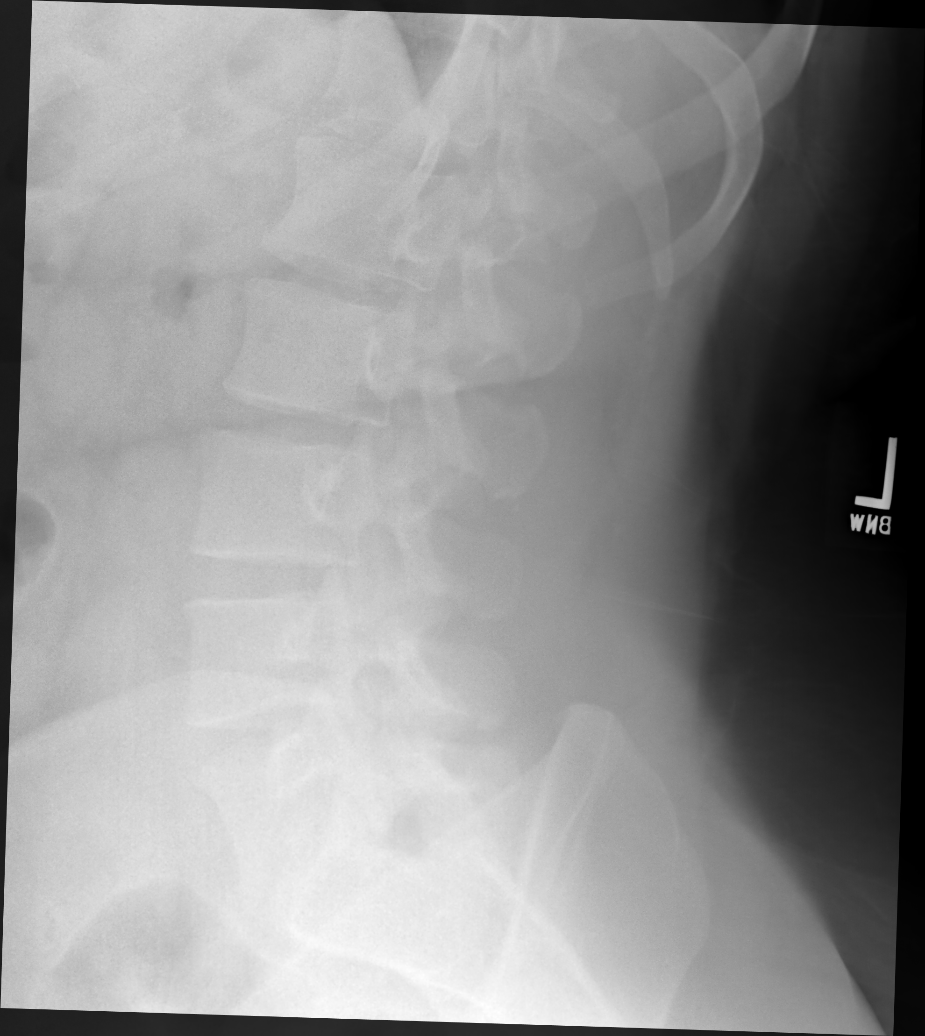

[lumbar spine lat (1 of 2)]
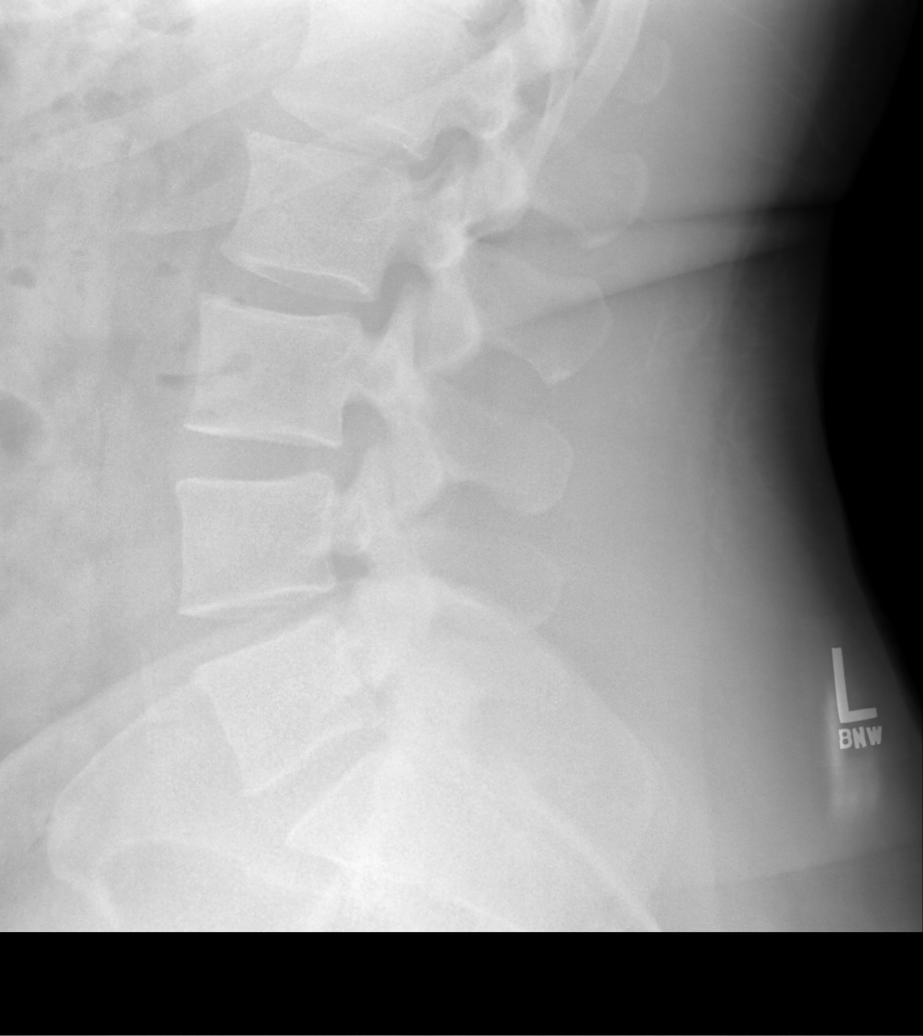

[lumbar spine lat (2 of 2)]
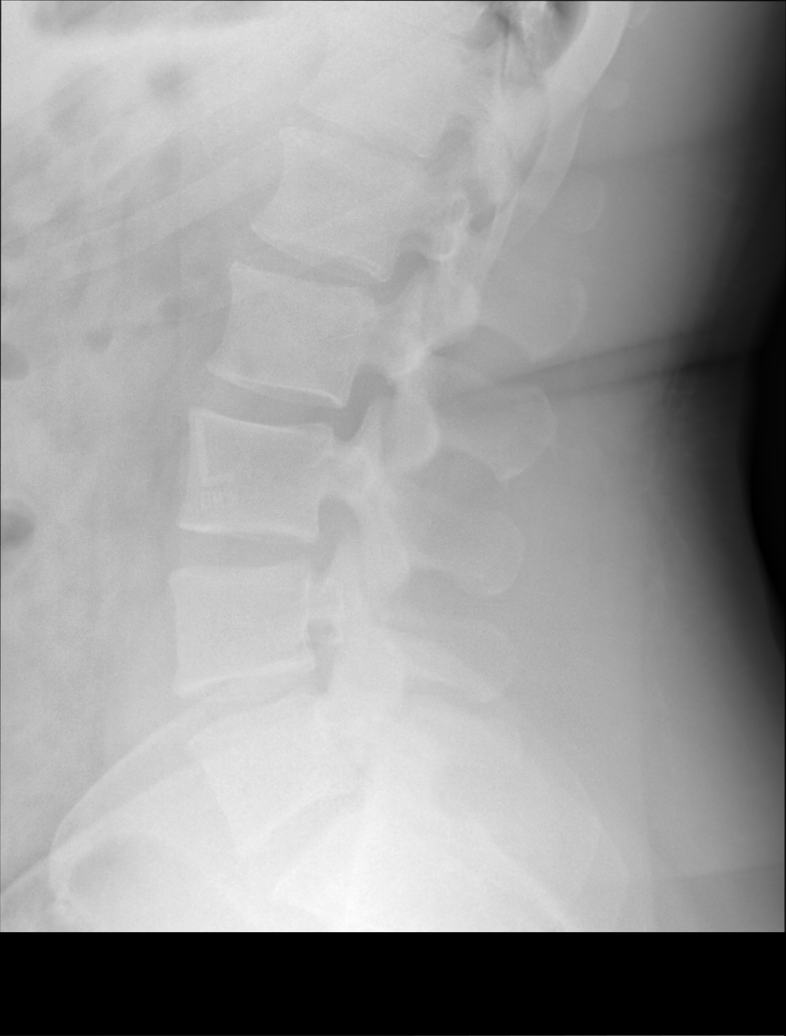

[lumbar spot lat]
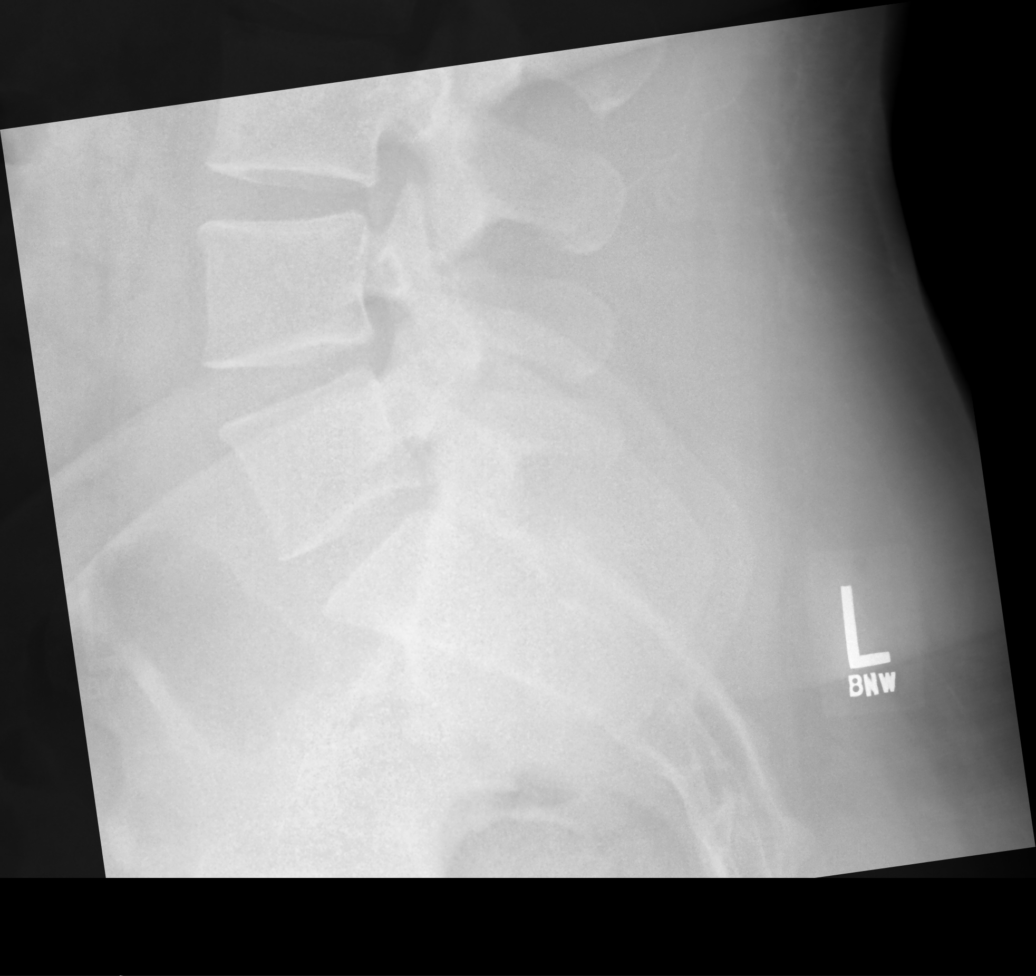

[5 of 5 positions shown; findings below may reference images not displayed]

FINDINGS: Frontal, lateral, spot lumbosacral lateral, and oblique views were
obtained. There are 5 non-rib-bearing lumbar type vertebral bodies.
There is slight lumbar dextroscoliosis. There is no evident fracture
or spondylolisthesis. Disc spaces appear normal. There is no
appreciable facet arthropathy.
IMPRESSION: Mild scoliosis. No fracture or spondylolisthesis. No appreciable
arthropathic change.

## 2020-02-07 ENCOUNTER — Other Ambulatory Visit: Payer: Self-pay

## 2020-02-07 ENCOUNTER — Ambulatory Visit
Admission: RE | Admit: 2020-02-07 | Discharge: 2020-02-07 | Disposition: A | Discharge status: Home / Self Care | Payer: 59 | Source: Ambulatory Visit | Attending: Family Medicine | Admitting: Family Medicine

## 2020-02-07 VITALS — BP 120/70 | HR 83 | Temp 97.9°F | Resp 16

## 2020-02-07 DIAGNOSIS — Z1152 Encounter for screening for COVID-19: Secondary | ICD-10-CM

## 2020-02-07 DIAGNOSIS — R197 Diarrhea, unspecified: Secondary | ICD-10-CM

## 2020-02-07 DIAGNOSIS — R112 Nausea with vomiting, unspecified: Secondary | ICD-10-CM | POA: Diagnosis not present

## 2020-02-07 DIAGNOSIS — Z7689 Persons encountering health services in other specified circumstances: Secondary | ICD-10-CM | POA: Diagnosis not present

## 2020-02-07 MED ORDER — ONDANSETRON HCL 4 MG PO TABS: 4.0000 mg | ORAL_TABLET | Freq: Four times a day (QID) | ORAL | 0 refills | Status: AC

## 2020-02-07 NOTE — ED Provider Notes (Signed)
[facility]   [id number] 02/07/20 Arrival Time: 1050   CC: COVID symptoms  SUBJECTIVE: History from: patient.  Jimmy Clark is a 34 y.o. male who presents with abrupt onset of nausea, vomiting, diarrhea for the last 3 days. No hx covid, has not had Covid vaccines. Thought that he might have had some bad food at first, but symptoms persisted. Denies sick exposure to COVID, flu or strep. Denies recent travel. Has not taken OTC medications for this. There are no aggravating or alleviating symptoms. Denies previous symptoms in the past. Denies fever, chills, fatigue, sinus pain, rhinorrhea, sore throat, SOB, wheezing, chest pain.  ROS: As per HPI.  All other pertinent ROS negative.     Past Medical History:  Diagnosis Date  . Chicken pox   . Hyperlipidemia   . Prediabetes    Past Surgical History:  Procedure Laterality Date  . FEMUR FRACTURE SURGERY  2008   No Known Allergies No current facility-administered medications on file prior to encounter.   Current Outpatient Medications on File Prior to Encounter  Medication Sig Dispense Refill  . azithromycin (ZITHROMAX) 500 MG tablet 2000 mg x 1 with food 4 tablet 0  . Cholecalciferol (VITAMIN D-3) 125 MCG (5000 UT) TABS Take by mouth.    . diclofenac (VOLTAREN) 75 MG EC tablet Take 1 tablet (75 mg total) by mouth 2 (two) times daily. 60 tablet 1   Social History   Socioeconomic History  . Marital status: Single    Spouse name: Not on file  . Number of children: Not on file  . Years of education: Not on file  . Highest education level: Not on file  Occupational History  . Not on file  Tobacco Use  . Smoking status: Never Smoker  . Smokeless tobacco: Never Used  Substance and Sexual Activity  . Alcohol use: Yes    Alcohol/week: 0.0 - 5.0 standard drinks  . Drug use: No  . Sexual activity: Not Currently    Partners: Female  Other Topics Concern  . Not on file  Social History Narrative   Attended [employer]     Works [employer]   Social Determinants of Health   Financial Resource Strain:   . Difficulty of Paying Living Expenses:   Food Insecurity:   . Worried About Programme researcher, broadcasting/film/video in the Last Year:   . Barista in the Last Year:   Transportation Needs:   . Freight forwarder (Medical):   Marland Kitchen Lack of Transportation (Non-Medical):   Physical Activity:   . Days of Exercise per Week:   . Minutes of Exercise per Session:   Stress:   . Feeling of Stress :   Social Connections:   . Frequency of Communication with Friends and Family:   . Frequency of Social Gatherings with Friends and Family:   . Attends Religious Services:   . Active Member of Clubs or Organizations:   . Attends Banker Meetings:   Marland Kitchen Marital Status:   Intimate Partner Violence:   . Fear of Current or Ex-Partner:   . Emotionally Abused:   Marland Kitchen Physically Abused:   . Sexually Abused:    Family History  Family history unknown: Yes    OBJECTIVE:  Vitals:   02/07/20 1103  BP: 120/70  Pulse: 83  Resp: 16  Temp: 97.9 F (36.6 C)  TempSrc: Oral  SpO2: 97%     General appearance: alert; appears fatigued, but nontoxic; speaking  in full sentences and tolerating own secretions HEENT: NCAT; Ears: EACs clear, TMs pearly gray; Eyes: PERRL.  EOM grossly intact. Sinuses: nontender; Nose: nares patent without rhinorrhea, Throat: oropharynx clear, tonsils non erythematous or enlarged, uvula midline  Neck: supple without LAD Lungs: unlabored respirations, symmetrical air entry; cough: absent; no respiratory distress; CTAB Heart: regular rate and rhythm.  Radial pulses 2+ symmetrical bilaterally Skin: warm and dry Psychological: alert and cooperative; normal mood and affect  LABS:  No results found for this or any previous visit (from the past 24 hour(s)).   ASSESSMENT & PLAN:  1. Nausea and vomiting, intractability of vomiting not specified, unspecified vomiting type   2. Encounter for  screening for COVID-19   3. Diarrhea, unspecified type     Meds ordered this encounter  Medications  . ondansetron (ZOFRAN) 4 MG tablet    Sig: Take 1 tablet (4 mg total) by mouth every 6 (six) hours.    Dispense:  12 tablet    Refill:  0    Order Specific Question:   Supervising Provider    Answer:   Merrilee Jansky [id number]    Prescribed Zofran for nausea Work note provided Likely viral etiology   COVID testing ordered.  It will take between 1-2 days for test results.  Someone will contact you regarding abnormal results.    Patient should remain in quarantine until they have received Covid results.  If negative you may resume normal activities (go back to work/school) while practicing hand hygiene, social distance, and mask wearing.  If positive, patient should remain in quarantine for 10 days from symptom onset AND greater than 72 hours after symptoms resolution (absence of fever without the use of fever-reducing medication and improvement in respiratory symptoms), whichever is longer Get plenty of rest and push fluids Use OTC zyrtec for nasal congestion, runny nose, and/or sore throat Use OTC flonase for nasal congestion and runny nose Use medications daily for symptom relief Use OTC medications like ibuprofen or tylenol as needed fever or pain Call or go to the ED if you have any new or worsening symptoms such as fever, worsening cough, shortness of breath, chest tightness, chest pain, turning blue, changes in mental status.  Reviewed expectations re: course of current medical issues. Questions answered. Outlined signs and symptoms indicating need for more acute intervention. Patient verbalized understanding. After Visit Summary given.         Moshe Cipro, NP 02/07/20 1131

## 2020-02-07 NOTE — Discharge Instructions (Addendum)
May drink pedialyte, gatorade, liquid IV, other electrolyte containing fluids to help rehydrate you  I have sent in Zofran for you to take one tablet every 6 hours as needed for nausea, vomiting  Your COVID test is pending.  You should self quarantine until the test result is back.    Take Tylenol as needed for fever or discomfort.  Rest and keep yourself hydrated.    Go to the emergency department if you develop acute worsening symptoms.

## 2020-02-07 NOTE — ED Triage Notes (Signed)
Pt presents to UC for n/v/d x3 days. Pt states he woke up Sunday with nausea/vomiting from possible bad food. Pt symptoms persisted through the next few days. Pt endorses eating heavy diet despite symptoms.   Pt denies fevers, chills, body aches, loss of taste or smell.   Pt denies OTC treatments or relieving factors.

## 2020-02-08 LAB — NOVEL CORONAVIRUS, NAA: SARS-CoV-2, NAA: NOT DETECTED

## 2020-02-08 LAB — SARS-COV-2, NAA 2 DAY TAT

## 2020-04-02 ENCOUNTER — Ambulatory Visit: Payer: 59 | Admitting: Internal Medicine

## 2020-05-24 ENCOUNTER — Ambulatory Visit: Payer: 59 | Admitting: Internal Medicine

## 2020-08-02 ENCOUNTER — Ambulatory Visit: Payer: 59 | Admitting: Internal Medicine

## 2020-09-12 ENCOUNTER — Ambulatory Visit: Payer: 59 | Admitting: Internal Medicine

## 2020-10-31 ENCOUNTER — Ambulatory Visit: Payer: 59 | Admitting: Internal Medicine

## 2021-01-24 ENCOUNTER — Ambulatory Visit: Payer: 59 | Admitting: Internal Medicine

## 2021-05-09 ENCOUNTER — Ambulatory Visit: Payer: 59 | Admitting: Internal Medicine

## 2021-08-01 ENCOUNTER — Ambulatory Visit: Payer: 59 | Admitting: Internal Medicine

## 2021-10-01 ENCOUNTER — Ambulatory Visit: Payer: Self-pay | Admitting: Internal Medicine

## 2021-10-07 ENCOUNTER — Ambulatory Visit: Payer: Medicaid Other | Admitting: Internal Medicine

## 2022-10-24 ENCOUNTER — Ambulatory Visit
Admission: EM | Admit: 2022-10-24 | Discharge: 2022-10-24 | Disposition: A | Discharge status: Home / Self Care | Payer: Medicaid Other

## 2022-10-24 DIAGNOSIS — J069 Acute upper respiratory infection, unspecified: Secondary | ICD-10-CM

## 2022-10-24 NOTE — ED Provider Notes (Signed)
Jimmy Clark    CSN: [id number] Arrival date & time: 10/24/22  1105      History   Chief Complaint No chief complaint on file.   HPI Jimmy Clark is a 37 y.o. male.   HPI  Patient presents to urgent care with complaint of symptoms which started 5 days ago after returning from a concert.  He reports vomiting and diarrhea, then developed lower extremity soreness, chills, night sweats.  He also is concerned because he recorded a 48 F body temperature and read about "hyperthermia" on the Internet.  Past Medical History:  Diagnosis Date   Chicken pox    Hyperlipidemia    Prediabetes     Patient Active Problem List   Diagnosis Date Noted   Flat foot 09/16/2018   OSA (obstructive sleep apnea) 09/16/2018   Vitamin D deficiency 03/22/2018   HLD (hyperlipidemia) 03/18/2018   Chronic low back pain 03/18/2018   Prediabetes 08/18/2016   Low HDL (under 40) 08/18/2016   Annual physical exam 08/18/2016    Past Surgical History:  Procedure Laterality Date   FEMUR FRACTURE SURGERY  2008       Home Medications    Prior to Admission medications   Medication Sig Start Date End Date Taking? Authorizing Provider  azithromycin (ZITHROMAX) 500 MG tablet 2000 mg x 1 with food 05/30/19   McLean-Scocuzza, Pasty Spillers, MD  Cholecalciferol (VITAMIN D-3) 125 MCG (5000 UT) TABS Take by mouth.    [provider]  diclofenac (VOLTAREN) 75 MG EC tablet Take 1 tablet (75 mg total) by mouth 2 (two) times daily. 05/02/19   Felecia Shelling, DPM  ondansetron (ZOFRAN) 4 MG tablet Take 1 tablet (4 mg total) by mouth every 6 (six) hours. 02/07/20   Moshe Cipro, NP    Family History Family History  Family history unknown: Yes    Social History Social History   Tobacco Use   Smoking status: Never   Smokeless tobacco: Never  Substance Use Topics   Alcohol use: Yes    Alcohol/week: 0.0 - 5.0 standard drinks of alcohol   Drug use: No     Allergies   Patient has no  known allergies.   Review of Systems Review of Systems   Physical Exam Triage Vital Signs ED Triage Vitals  Enc Vitals Group     BP      Pulse      Resp      Temp      Temp src      SpO2      Weight      Height      Head Circumference      Peak Flow      Pain Score      Pain Loc      Pain Edu?      Excl. in GC?    No data found.  Updated Vital Signs There were no vitals taken for this visit.  Visual Acuity Right Eye Distance:   Left Eye Distance:   Bilateral Distance:    Right Eye Near:   Left Eye Near:    Bilateral Near:     Physical Exam Vitals reviewed.  Constitutional:      Appearance: Normal appearance.  Cardiovascular:     Rate and Rhythm: Normal rate and regular rhythm.     Pulses: Normal pulses.     Heart sounds: Normal heart sounds.  Pulmonary:     Effort: Pulmonary effort is normal.  Breath sounds: Normal breath sounds.  Skin:    General: Skin is warm and dry.  Neurological:     General: No focal deficit present.     Mental Status: He is alert and oriented to person, place, and time.  Psychiatric:        Mood and Affect: Mood normal.        Behavior: Behavior normal.      UC Treatments / Results  Labs (all labs ordered are listed, but only abnormal results are displayed) Labs Reviewed - No data to display  EKG   Radiology No results found.  Procedures Procedures (including critical care time)  Medications Ordered in UC Medications - No data to display  Initial Impression / Assessment and Plan / UC Course  I have reviewed the triage vital signs and the nursing notes.  Pertinent labs & imaging results that were available during my care of the patient were reviewed by me and considered in my medical decision making (see chart for details).   Jimmy Clark is a 37 y.o. male presenting with flu like symptoms. Patient is afebrile without recent antipyretics, satting well on room air. Overall is well  appearing and  non-toxic, well hydrated, without respiratory distress. Pulmonary exam is unremarkable.  Lungs CTAB without wheezing, rhonchi, rales.  Anxiety.  Patient's symptoms are consistent with an acute viral process.  Provided patient reassurance regarding his concern for reduced body temperature as his temperature in clinic is WNL.  Encouraged him to manage his temperature and night sweats as well as body aches with use of Tylenol or ibuprofen.  Recommended pushing hydration and diet as tolerated.  Counseled patient on potential for adverse effects with medications prescribed/recommended today, ER and return-to-clinic precautions discussed, patient verbalized understanding and agreement with care plan.   Final Clinical Impressions(s) / UC Diagnoses   Final diagnoses:  None   Discharge Instructions   None    ED Prescriptions   None    PDMP not reviewed this encounter.   Charma Igo, [place] 10/24/22 1316

## 2022-10-24 NOTE — Discharge Instructions (Signed)
Continue to use fever reducing medication.  This will help to relieve your body soreness as well. Follow up here or with your primary care provider if your symptoms are worsening or not improving.

## 2022-10-24 NOTE — ED Triage Notes (Signed)
Patient presents to UC lower extremity soreness, chills on Thursday.  Pt states he thinks he had stomach bug, reports vomiting and diarrhea  started Monday. States on weds symptoms had improved but concerned with leg soreness that is not better. Not taking any medications.

## 2023-05-18 ENCOUNTER — Other Ambulatory Visit: Payer: Self-pay

## 2023-05-18 ENCOUNTER — Ambulatory Visit
Admission: EM | Admit: 2023-05-18 | Discharge: 2023-05-18 | Disposition: A | Discharge status: Home / Self Care | Payer: Medicaid Other | Attending: Emergency Medicine | Admitting: Emergency Medicine

## 2023-05-18 DIAGNOSIS — J069 Acute upper respiratory infection, unspecified: Secondary | ICD-10-CM

## 2023-05-18 DIAGNOSIS — R062 Wheezing: Secondary | ICD-10-CM

## 2023-05-18 MED ORDER — PREDNISONE 20 MG PO TABS
40.0000 mg | ORAL_TABLET | Freq: Every day | ORAL | 0 refills | Status: AC
  Filled 2023-05-18: qty 10, 5d supply, fill #0

## 2023-05-18 MED ORDER — DEXAMETHASONE SODIUM PHOSPHATE 10 MG/ML IJ SOLN
10.0000 mg | Freq: Once | INTRAMUSCULAR | Status: AC
  Administered 2023-05-18: 10 mg via INTRAMUSCULAR

## 2023-05-18 NOTE — ED Provider Notes (Signed)
[name]    CSN: [id number] Arrival date & time: 05/18/23  1029      History   Chief Complaint Chief Complaint  Patient presents with   Wheezing    HPI Jimmy Clark is a 37 y.o. male.   Patient presents for evaluation of nasal congestion, nonproductive cough and wheezing present for 4 days.  Symptoms worsened overnight interfering with sleep.  No known sick contacts prior.  Initially had poor appetite but has improved and now tolerating both food and liquids.  Denies fever, ear pain, sore throat, shortness of breath or abdominal symptoms.  Has attempted use of Vicks vapor, Mucinex, NyQuil and DayQuil which have been effective in managing congestion and cough.  Denies respiratory history.  Marijuana usage.  Past Medical History:  Diagnosis Date   Chicken pox    Hyperlipidemia    Prediabetes     Patient Active Problem List   Diagnosis Date Noted   Flat foot 09/16/2018   OSA (obstructive sleep apnea) 09/16/2018   Vitamin D deficiency 03/22/2018   HLD (hyperlipidemia) 03/18/2018   Chronic low back pain 03/18/2018   Prediabetes 08/18/2016   Low HDL (under 40) 08/18/2016   Annual physical exam 08/18/2016    Past Surgical History:  Procedure Laterality Date   FEMUR FRACTURE SURGERY  2008       Home Medications    Prior to Admission medications   Medication Sig Start Date End Date Taking? Authorizing Provider  predniSONE (DELTASONE) 20 MG tablet Take 2 tablets (40 mg total) by mouth daily. 05/18/23  Yes Valinda Hoar, NP  azithromycin (ZITHROMAX) 500 MG tablet 2000 mg x 1 with food Patient not taking: Reported on 05/18/2023 05/30/19   McLean-Scocuzza, Pasty Spillers, MD  Cholecalciferol (VITAMIN D-3) 125 MCG (5000 UT) TABS Take by mouth.    [provider]  diclofenac (VOLTAREN) 75 MG EC tablet Take 1 tablet (75 mg total) by mouth 2 (two) times daily. Patient not taking: Reported on 05/18/2023 05/02/19   Felecia Shelling, DPM  ondansetron (ZOFRAN) 4  MG tablet Take 1 tablet (4 mg total) by mouth every 6 (six) hours. Patient not taking: Reported on 05/18/2023 02/07/20   Moshe Cipro, FNP    Family History Family History  Family history unknown: Yes    Social History Social History   Tobacco Use   Smoking status: Never   Smokeless tobacco: Never  Vaping Use   Vaping status: Never Used  Substance Use Topics   Alcohol use: Yes    Alcohol/week: 0.0 - 5.0 standard drinks of alcohol   Drug use: Yes    Types: Marijuana     Allergies   Patient has no known allergies.   Review of Systems Review of Systems   Physical Exam Triage Vital Signs ED Triage Vitals  Encounter Vitals Group     BP 05/18/23 1052 (!) 139/92     Systolic BP Percentile --      Diastolic BP Percentile --      Pulse Rate 05/18/23 1052 87     Resp 05/18/23 1052 18     Temp 05/18/23 1052 99.5 F (37.5 C)     Temp Source 05/18/23 1052 Oral     SpO2 05/18/23 1052 95 %     Weight --      Height --      Head Circumference --      Peak Flow --      Pain Score 05/18/23 1048 0  Pain Loc --      Pain Education --      Exclude from Growth Chart --    No data found.  Updated Vital Signs BP (!) 139/92 (BP Location: Left Arm)   Pulse 87   Temp 99.5 F (37.5 C) (Oral)   Resp 18   SpO2 95%   Visual Acuity Right Eye Distance:   Left Eye Distance:   Bilateral Distance:    Right Eye Near:   Left Eye Near:    Bilateral Near:     Physical Exam Constitutional:      Appearance: Normal appearance.  HENT:     Right Ear: Tympanic membrane, ear canal and external ear normal.     Left Ear: Tympanic membrane, ear canal and external ear normal.     Nose: Congestion present. No rhinorrhea.     Mouth/Throat:     Pharynx: Posterior oropharyngeal erythema present. No oropharyngeal exudate.  Eyes:     Extraocular Movements: Extraocular movements intact.  Cardiovascular:     Rate and Rhythm: Normal rate and regular rhythm.     Pulses: Normal  pulses.     Heart sounds: Normal heart sounds.  Pulmonary:     Effort: Pulmonary effort is normal.     Breath sounds: Wheezing present.  Skin:    General: Skin is warm and dry.  Neurological:     Mental Status: He is alert and oriented to person, place, and time. Mental status is at baseline.      UC Treatments / Results  Labs (all labs ordered are listed, but only abnormal results are displayed) Labs Reviewed - No data to display  EKG   Radiology No results found.  Procedures Procedures (including critical care time)  Medications Ordered in UC Medications  dexamethasone (DECADRON) injection 10 mg (10 mg Intramuscular Given 05/18/23 1110)    Initial Impression / Assessment and Plan / UC Course  I have reviewed the triage vital signs and the nursing notes.  Pertinent labs & imaging results that were available during my care of the patient were reviewed by me and considered in my medical decision making (see chart for details).  Viral URI with cough,  wheezing  Patient is in no signs of distress nor toxic appearing.  Vital signs are stable.  Low suspicion for pneumonia, pneumothorax or bronchitis and therefore will defer imaging.  Decadron injection given prior to discharge, prescribed prednisone burst for outpatient, declined prescription cough medicine.May use additional over-the-counter medications as needed for supportive care.  May follow-up with urgent care as needed if symptoms persist or worsen.   Final Clinical Impressions(s) / UC Diagnoses   Final diagnoses:  Viral URI with cough  Wheezing     Discharge Instructions      Your symptoms today are most likely being caused by a virus and should steadily improve in time it can take up to 7 to 10 days before you truly start to see a turnaround however things will get better  You have been  given an injection of steroids to help reduce symptoms quicker, ideally will see improvement within the next hour  Running  tomorrow begin prednisone every morning with food for 5 days to open and relax airway should help advised wheezing and persistence of cough    You can take Tylenol and/or Ibuprofen as needed for fever reduction and pain relief.   For cough: honey 1/2 to 1 teaspoon (you can dilute the honey in water or another fluid).  You can also use guaifenesin and dextromethorphan for cough. You can use a humidifier for chest congestion and cough.  If you don't have a humidifier, you can sit in the bathroom with the hot shower running.      For sore throat: try warm salt water gargles, cepacol lozenges, throat spray, warm tea or water with lemon/honey, popsicles or ice, or OTC cold relief medicine for throat discomfort.   For congestion: take a daily anti-histamine like Zyrtec, Claritin, and a oral decongestant, such as pseudoephedrine.  You can also use Flonase 1-2 sprays in each nostril daily.   It is important to stay hydrated: drink plenty of fluids (water, gatorade/powerade/pedialyte, juices, or teas) to keep your throat moisturized and help further relieve irritation/discomfort.    ED Prescriptions     Medication Sig Dispense Auth. Provider   predniSONE (DELTASONE) 20 MG tablet Take 2 tablets (40 mg total) by mouth daily. 10 tablet Valinda Hoar, NP      PDMP not reviewed this encounter.   Valinda Hoar, NP 05/18/23 1116

## 2023-05-18 NOTE — ED Triage Notes (Signed)
Patient to Urgent Care with complaints of wheezing/ URI/ dry cough.   Symptoms started Tuesday. Taking Dayquil/ Nyquil/ Mucinex/ Vicks.

## 2023-05-18 NOTE — Discharge Instructions (Addendum)
Your symptoms today are most likely being caused by a virus and should steadily improve in time it can take up to 7 to 10 days before you truly start to see a turnaround however things will get better  You have been  given an injection of steroids to help reduce symptoms quicker, ideally will see improvement within the next hour  Running tomorrow begin prednisone every morning with food for 5 days to open and relax airway should help advised wheezing and persistence of cough    You can take Tylenol and/or Ibuprofen as needed for fever reduction and pain relief.   For cough: honey 1/2 to 1 teaspoon (you can dilute the honey in water or another fluid).  You can also use guaifenesin and dextromethorphan for cough. You can use a humidifier for chest congestion and cough.  If you don't have a humidifier, you can sit in the bathroom with the hot shower running.      For sore throat: try warm salt water gargles, cepacol lozenges, throat spray, warm tea or water with lemon/honey, popsicles or ice, or OTC cold relief medicine for throat discomfort.   For congestion: take a daily anti-histamine like Zyrtec, Claritin, and a oral decongestant, such as pseudoephedrine.  You can also use Flonase 1-2 sprays in each nostril daily.   It is important to stay hydrated: drink plenty of fluids (water, gatorade/powerade/pedialyte, juices, or teas) to keep your throat moisturized and help further relieve irritation/discomfort.
